# Patient Record
Sex: Female | Born: 1950 | Race: Black or African American | Hispanic: No | Smoking: Current every day smoker
Health system: Southern US, Community
[De-identification: ages and names within clinical notes are randomized; demographics above are authoritative.]

## PROBLEM LIST (undated history)

## (undated) DIAGNOSIS — E079 Disorder of thyroid, unspecified: Secondary | ICD-10-CM

## (undated) HISTORY — DX: Disorder of thyroid, unspecified: E07.9

---

## 2000-01-30 ENCOUNTER — Other Ambulatory Visit: Admission: RE | Admit: 2000-01-30 | Discharge: 2000-01-30 | Payer: Self-pay | Admitting: Obstetrics and Gynecology

## 2001-03-04 ENCOUNTER — Other Ambulatory Visit: Admission: RE | Admit: 2001-03-04 | Discharge: 2001-03-04 | Payer: Self-pay | Admitting: Obstetrics and Gynecology

## 2016-04-18 ENCOUNTER — Encounter: Payer: Self-pay | Admitting: Internal Medicine

## 2016-04-18 ENCOUNTER — Ambulatory Visit (INDEPENDENT_AMBULATORY_CARE_PROVIDER_SITE_OTHER): Payer: Medicare Other | Admitting: Internal Medicine

## 2016-04-18 VITALS — BP 146/76 | HR 60 | Ht 60.0 in | Wt 132.0 lb

## 2016-04-18 DIAGNOSIS — R7303 Prediabetes: Secondary | ICD-10-CM

## 2016-04-18 DIAGNOSIS — E890 Postprocedural hypothyroidism: Secondary | ICD-10-CM | POA: Diagnosis not present

## 2016-04-18 DIAGNOSIS — Z23 Encounter for immunization: Secondary | ICD-10-CM

## 2016-04-18 LAB — TSH: TSH: 1.37 u[IU]/mL (ref 0.35–4.50)

## 2016-04-18 LAB — T4, FREE: FREE T4: 1 ng/dL (ref 0.60–1.60)

## 2016-04-18 NOTE — Patient Instructions (Addendum)
Please continue Levothyroxine 75 mg 6/7 days.  Take the thyroid hormone every day, with water, at least 30 minutes before breakfast, separated by at least 4 hours from: - acid reflux medications - calcium - iron - multivitamins  Please call and schedule an eye appt with Dr. Randon Goldsmith: [facility]:  Dr. Jeni Salles MD ?  Address: [address] [place], [place], [place] [address]  [phone]  Please come back for a follow-up appointment in 6 months.  PATIENT INSTRUCTIONS FOR TYPE 2 DIABETES:  **Please join MyChart!** - see attached instructions about how to join if you have not done so already.  DIET AND EXERCISE Diet and exercise is an important part of diabetic treatment.  We recommended aerobic exercise in the form of brisk walking (working between 40-60% of maximal aerobic capacity, similar to brisk walking) for 150 minutes per week (such as 30 minutes five days per week) along with 3 times per week performing 'resistance' training (using various gauge rubber tubes with handles) 5-10 exercises involving the major muscle groups (upper body, lower body and core) performing 10-15 repetitions (or near fatigue) each exercise. Start at half the above goal but build slowly to reach the above goals. If limited by weight, joint pain, or disability, we recommend daily walking in a swimming pool with water up to waist to reduce pressure from joints while allow for adequate exercise.    BLOOD GLUCOSES Monitoring your blood glucoses is important for continued management of your diabetes. Please check your blood glucoses 2-4 times a day: fasting, before meals and at bedtime (you can rotate these measurements - e.g. one day check before the 3 meals, the next day check before 2 of the meals and before bedtime, etc.).   HYPOGLYCEMIA (low blood sugar) Hypoglycemia is usually a reaction to not eating, exercising, or taking too much insulin/ other diabetes drugs.  Symptoms include  tremors, sweating, hunger, confusion, headache, etc. Treat IMMEDIATELY with 15 grams of Carbs: . 4 glucose tablets .  cup regular juice/soda . 2 tablespoons raisins . 4 teaspoons sugar . 1 tablespoon honey Recheck blood glucose in 15 mins and repeat above if still symptomatic/blood glucose <100.  RECOMMENDATIONS TO REDUCE YOUR RISK OF DIABETIC COMPLICATIONS: * Take your prescribed MEDICATION(S) * Follow a DIABETIC diet: Complex carbs, fiber rich foods, (monounsaturated and polyunsaturated) fats * AVOID saturated/trans fats, high fat foods, >2,300 mg salt per day. * EXERCISE at least 5 times a week for 30 minutes or preferably daily.  * DO NOT SMOKE OR DRINK more than 1 drink a day. * Check your FEET every day. Do not wear tightfitting shoes. Contact us if you develop an ulcer * See your EYE doctor once a year or more if needed * Get a FLU shot once a year * Get a PNEUMONIA vaccine once before and once after age 31 years  GOALS:  * Your Hemoglobin A1c of <7%  * fasting sugars need to be <130 * after meals sugars need to be <180 (2h after you start eating) * Your Systolic BP should be 140 or lower  * Your Diastolic BP should be 80 or lower  * Your HDL (Good Cholesterol) should be 40 or higher  * Your LDL (Bad Cholesterol) should be 100 or lower. * Your Triglycerides should be 150 or lower  * Your Urine microalbumin (kidney function) should be <30 * Your Body Mass Index should be 25 or lower    Please consider the following ways to  cut down carbs and fat and increase fiber and micronutrients in your diet: - substitute whole grain for white bread or pasta - substitute brown rice for white rice - substitute 90-calorie flat bread pieces for slices of bread when possible - substitute sweet potatoes or yams for white potatoes - substitute humus for margarine - substitute tofu for cheese when possible - substitute almond or rice milk for regular milk (would not drink soy milk daily  due to concern for soy estrogen influence on breast cancer risk) - substitute dark chocolate for other sweets when possible - substitute water - can add lemon or orange slices for taste - for diet sodas (artificial sweeteners will trick your body that you can eat sweets without getting calories and will lead you to overeating and weight gain in the long run) - do not skip breakfast or other meals (this will slow down the metabolism and will result in more weight gain over time)  - can try smoothies made from fruit and almond/rice milk in am instead of regular breakfast - can also try old-fashioned (not instant) oatmeal made with almond/rice milk in am - order the dressing on the side when eating salad at a restaurant (pour less than half of the dressing on the salad) - eat as little meat as possible - can try juicing, but should not forget that juicing will get rid of the fiber, so would alternate with eating raw veg./fruits or drinking smoothies - use as little oil as possible, even when using olive oil - can dress a salad with a mix of balsamic vinegar and lemon juice, for e.g. - use agave nectar, stevia sugar, or regular sugar rather than artificial sweateners - steam or broil/roast veggies  - snack on veggies/fruit/nuts (unsalted, preferably) when possible, rather than processed foods - reduce or eliminate aspartame in diet (it is in diet sodas, chewing gum, etc) Read the labels!  Try to read Dr. Katherina RightNeal Barnard's book: "Program for Reversing Diabetes" for other ideas for healthy eating.

## 2016-04-18 NOTE — Progress Notes (Signed)
Patient ID: Vanessa Steele, female   DOB: Jul 15, 1951, 65 y.o.   MRN: [id number]    HPI  Vanessa Steele is a 65 y.o.-year-old female, self-referred, for management of postablative hypothyroidism and prediabetes. She recently moved to [place] from [place]. She is originally from here.  Pt. has been dx with hypothyroidism in 06/2007 after radioactive iodine ablation for Graves' disease >> on Synthroid 75 mcg 6 out of 7 days.  She takes the thyroid hormone: - every day - fasting - with water - separated by 1h min from b'fast  - + coffee + cream - no calcium, iron, PPIs - + multivitamins at lunchtime  I reviewed pt's thyroid tests: 11/05/2015: TSH 2.010, free T4 1.27 07/10/2015: TSH 2.59 03/10/2015: TSH 0.109, free T4 1.78 09/30/2014: TSH 0.365, free T4 1.79  Pt describes: - weight gain - fatigue - cold intolerance - depression - + constipation - dry skin - hair loss  Pt denies feeling nodules in neck, hoarseness, dysphagia/odynophagia, SOB with lying down.  She has + FH of thyroid disorders in: 2 sisters: hyper and hypothyroidism. No FH of thyroid cancer.  No h/o radiation tx to head or neck, RAI tx in 2008. No recent use of iodine supplements.  She is  also has prediabetes:   She was on metformin 1000 mg 2x a day now stopped. >>   Last hemoglobin A1c: 6.1% (11/12/2015).  Last BUN/Cr: 11/06/15: 13/0.82, eGFR 87  Last Lipid panel: 07/10/2015: 221/87/65/139. Not on statins.   Last eye exam - undilated - 2016.   No FH of DM2. Mother and father with HTN.  ROS: Constitutional: no weight gain/loss, no fatigue, no subjective hyperthermia/hypothermia Eyes: + blurry vision, no xerophthalmia ENT: no sore throat, no nodules palpated in throat, no dysphagia/odynophagia, no hoarseness Cardiovascular: no CP/SOB/palpitations/+ slight leg swelling Respiratory: no cough/SOB Gastrointestinal: no N/V/D/+ C Musculoskeletal: no muscle/+ joint aches Skin: no  rashes Neurological: no tremors/numbness/tingling/dizziness Psychiatric: no depression/anxiety  PMH: - Hypothyroidism, postablative - Prediabetes  No past surgical history.  Social History   Social History  . Marital status: Legally Separated    Spouse name: N/A  . Number of children: 0   Occupational History  . retired   Social History Main Topics  . Smoking status: Current Every Day Smoker: 1/2-1 PPD  . Smokeless tobacco: No  . Alcohol use No  . Drug use: No   Meds: - Levothyroxine 75 mcg 6/7 days - vitamin D 1000 IU daily - K 550 mg daily - Mg 400 mg daily - Centrum MVI 1 a day  NKDA   FH: - see HPI+ HTN in M and F Heart ds in M Cancer in F  PE: BP (!) 146/76   Pulse 60   Ht 5' (1.524 m)   Wt 132 lb (59.9 kg)   BMI 25.78 kg/m  Wt Readings from Last 3 Encounters:  04/18/16 132 lb (59.9 kg)   Constitutional: normal weight, in NAD Eyes: PERRLA, EOMI, no exophthalmos ENT: moist mucous membranes, no thyromegaly, no cervical lymphadenopathy Cardiovascular: RRR, No MRG Respiratory: CTA B Gastrointestinal: abdomen soft, NT, ND, BS+ Musculoskeletal: no deformities, strength intact in all 4 Skin: moist, warm, no rashes Neurological: no tremor with outstretched hands, DTR normal in all 4  ASSESSMENT: 1. Hypothyroidism - postablative  2. Prediabetes  PLAN:  1. Patient with long-standing hypothyroidism after RAI tx for Graves ds., on levothyroxine therapy. She appears euthyroid and latest TFTs were normal on her current LT4 dose, of 75 mcg  6/7 days - she does not appear to have a goiter, thyroid nodules, or neck compression symptoms - We discussed about correct intake of levothyroxine, fasting, with water, separated by at least 30 minutes from breakfast, and separated by more than 4 hours from calcium, iron, multivitamins, acid reflux medications (PPIs). She is taking it correctly. - will check thyroid tests today: TSH, free T4 - If labs today are  abnormal, she will need to return in 6 weeks for repeat labs - Otherwise, I will see her back in 6 months  2. Prediabetes - was on metformin but stopped 2/2 negative publicity >> discussed that it is probably the best DM drug we have. However, I am not sure if she needs to restart it: will check a HbA1c today. - she is not checking sugars at home >> meter lost during moving from [place]. She will look for it >> advised to check at least 1x every 2nd day - discussed about improving diet - will recheck Lipids at next visit >> will liekly need a statin - advised to get a new eye exam >> dilated >> recommended Dr Prudencio Burly  Component     Latest Ref Rng & Units 04/18/2016  T4,Free(Direct)     0.60 - 1.60 ng/dL 1.00  TSH     0.35 - 4.50 uIU/mL 1.37  Thyroid tests are great. We'll continue the current dose of levothyroxine.  Philemon Kingdom, MD PhD [facility]

## 2016-04-21 ENCOUNTER — Encounter: Payer: Self-pay | Admitting: Internal Medicine

## 2016-04-21 ENCOUNTER — Telehealth: Payer: Self-pay

## 2016-04-21 NOTE — Telephone Encounter (Signed)
Called and left voicemail for patient regarding lab results, and no changes needing to be made to medications.

## 2016-04-22 ENCOUNTER — Telehealth: Payer: Self-pay

## 2016-04-22 ENCOUNTER — Other Ambulatory Visit: Payer: Self-pay

## 2016-04-22 MED ORDER — LEVOTHYROXINE SODIUM 75 MCG PO TABS: 75.0000 ug | ORAL_TABLET | Freq: Every day | ORAL | 1 refills | Status: DC

## 2016-04-22 NOTE — Telephone Encounter (Signed)
Called and left message for patient to call back to make nurse visit for a1c. Gave call back number.

## 2016-04-25 ENCOUNTER — Other Ambulatory Visit (INDEPENDENT_AMBULATORY_CARE_PROVIDER_SITE_OTHER): Payer: Medicare Other

## 2016-04-25 ENCOUNTER — Other Ambulatory Visit: Payer: Medicare Other

## 2016-04-25 ENCOUNTER — Other Ambulatory Visit: Payer: Self-pay

## 2016-04-25 DIAGNOSIS — R7303 Prediabetes: Secondary | ICD-10-CM | POA: Diagnosis not present

## 2016-04-25 LAB — POCT GLYCOSYLATED HEMOGLOBIN (HGB A1C): Hemoglobin A1C: 5.8

## 2016-10-17 ENCOUNTER — Ambulatory Visit: Payer: Medicare Other | Admitting: Internal Medicine

## 2016-10-20 ENCOUNTER — Encounter: Payer: Self-pay | Admitting: Internal Medicine

## 2016-10-21 ENCOUNTER — Other Ambulatory Visit: Payer: Self-pay

## 2016-10-21 MED ORDER — LEVOTHYROXINE SODIUM 75 MCG PO TABS: 75.0000 ug | ORAL_TABLET | Freq: Every day | ORAL | 1 refills | Status: DC

## 2016-11-24 ENCOUNTER — Ambulatory Visit (INDEPENDENT_AMBULATORY_CARE_PROVIDER_SITE_OTHER): Payer: Medicare Other | Admitting: Internal Medicine

## 2016-11-24 ENCOUNTER — Encounter: Payer: Self-pay | Admitting: Internal Medicine

## 2016-11-24 VITALS — BP 130/82 | HR 78 | Wt 141.0 lb

## 2016-11-24 DIAGNOSIS — R7303 Prediabetes: Secondary | ICD-10-CM

## 2016-11-24 DIAGNOSIS — E890 Postprocedural hypothyroidism: Secondary | ICD-10-CM

## 2016-11-24 LAB — MICROALBUMIN / CREATININE URINE RATIO
Creatinine,U: 215.2 mg/dL
Microalb Creat Ratio: 0.6 mg/g (ref 0.0–30.0)
Microalb, Ur: 1.3 mg/dL (ref 0.0–1.9)

## 2016-11-24 LAB — LIPID PANEL
CHOLESTEROL: 208 mg/dL — AB (ref 0–200)
HDL: 58.4 mg/dL (ref >39.00)
LDL CALC: 119 mg/dL — AB (ref 0–99)
NonHDL: 149.73
TRIGLYCERIDES: 153 mg/dL — AB (ref 0.0–149.0)
Total CHOL/HDL Ratio: 4
VLDL: 30.6 mg/dL (ref 0.0–40.0)

## 2016-11-24 LAB — TSH: TSH: 1.26 u[IU]/mL (ref 0.35–4.50)

## 2016-11-24 LAB — T4, FREE: FREE T4: 0.82 ng/dL (ref 0.60–1.60)

## 2016-11-24 LAB — HEMOGLOBIN A1C: Hgb A1c MFr Bld: 6.1 % (ref 4.6–6.5)

## 2016-11-24 NOTE — Progress Notes (Signed)
Patient ID: Vanessa Steele, female   DOB: 1951/02/09, 66 y.o.   MRN: [id number]    HPI  Vanessa Steele is a 66 y.o.-year-old female, returning for follow-up for postablative hypothyroidism and prediabetes.  Last visit 8 months ago. New PCP: Dr. Hilma Favors  Pt. has been dx with hypothyroidism in 06/2007 after radioactive iodine ablation for Graves' disease >> on Synthroid 75 mcg 6 out of 7 days.  She takes this: - in am - fasting - + coffee + creamer + Splenda after she walks the dog (1.5 hrs)  - at least 30 min from b'fast - no Ca, Fe, MVI, PPIs - not on Biotin  I reviewed pt's thyroid tests: Lab Results  Component Value Date   TSH 1.37 04/18/2016   FREET4 1.00 04/18/2016  11/05/2015: TSH 2.010, free T4 1.27 07/10/2015: TSH 2.59 03/10/2015: TSH 0.109, free T4 1.78 09/30/2014: TSH 0.365, free T4 1.79  Pt describes: - + weight gain  No: - fatigue - cold intolerance - depression - constipation - dry skin - hair loss  Pt denies feeling nodules in neck, hoarseness, dysphagia/odynophagia, SOB with lying down.  She has + FH of thyroid disorders in: 2 sisters: hyper and hypothyroidism. No FH of thyroid cancer.  No h/o radiation tx to head or neck, RAI tx in 2008. No recent use of iodine supplements.  She is  also has prediabetes:   She gained weight since last visit >> eating more frequently, grazing, high calorie foods.  She was on metformin 1000 mg twice a day, but stopped. I kept her off at last visit.  Last hemoglobin A1c:  Lab Results  Component Value Date   HGBA1C 5.8 04/25/2016  11/12/2015: HbA1c: 6.1%  Last BUN/Cr: 11/06/15: 13/0.82, eGFR 87  Last Lipid panel: 07/10/2015: 221/87/65/139. Not on statins.   Last eye exam - undilated in 2016  No FH of DM2. Mother and father with HTN.  ROS: Constitutional: + weight gain, no fatigue, no subjective hyperthermia, no subjective hypothermia Eyes: no blurry vision, no xerophthalmia ENT: no sore throat, no  nodules palpated in throat, no dysphagia, no odynophagia, no hoarseness Cardiovascular: no CP/no SOB/no palpitations/no leg swelling Respiratory: no cough/no SOB/no wheezing Gastrointestinal: no N/no V/no D/no C/no acid reflux Musculoskeletal: no muscle aches/no joint aches Skin: no rashes, no hair loss Neurological: no tremors/no numbness/no tingling/no dizziness  I reviewed pt's medications, allergies, PMH, social hx, family hx, and changes were documented in the history of present illness. Otherwise, unchanged from my initial visit note.  PMH: - Hypothyroidism, postablative - Prediabetes  No past surgical history.  Social History   Social History  . Marital status: Legally Separated    Spouse name: N/A  . Number of children: 0   Occupational History  . retired   Social History Main Topics  . Smoking status: Current Every Day Smoker: 1/2-1 PPD  . Smokeless tobacco: No  . Alcohol use No  . Drug use: No   Meds: - Levothyroxine 75 mcg 6/7 days - vitamin D 1000 IU daily - K 550 mg daily - Mg 400 mg daily - Centrum MVI 1 a day  NKDA   FH: - see HPI+ HTN in M and F Heart ds in M Cancer in F  PE: BP 130/82 (BP Location: Left Arm, Patient Position: Sitting)   Pulse 78   Wt 141 lb (64 kg)   SpO2 98%   BMI 27.54 kg/m  Wt Readings from Last 3 Encounters:  11/24/16 141 lb (  64 kg)  04/18/16 132 lb (59.9 kg)   Constitutional: Slightly overweight, in NAD Eyes: PERRLA, EOMI, no exophthalmos ENT: moist mucous membranes, no thyromegaly, no cervical lymphadenopathy Cardiovascular: RRR, No MRG Respiratory: CTA B Gastrointestinal: abdomen soft, NT, ND, BS+ Musculoskeletal: no deformities, strength intact in all 4 Skin: moist, warm, no rashes Neurological: no tremor with outstretched hands, DTR normal in all 4  ASSESSMENT: 1. Hypothyroidism - postablative  2. Prediabetes  PLAN:  1. Patient with long-standing hypothyroidism after RAI tx for Graves ds., on  levothyroxine therapy: 75 mcg 6/7 days - latest thyroid labs reviewed with pt >> normal  - pt feels good on this dose. - we discussed about taking the thyroid hormone every day, with water, >30 minutes before breakfast, separated by >4 hours from acid reflux medications, calcium, iron, multivitamins. Pt. is taking it correctly - will check thyroid tests today: TSH and fT4 - If labs are abnormal, she will need to return for repeat TFTs in 1.5 months - OTW, Return in about 6 months (around 05/27/2017).  2. Prediabetes - was on metformin but stopped 2/2 negative publicity >> at last visit, we did not restart, as her HbA1c was very good, at 5.8%. However, since then, she gained a significant amount of weight (9 pounds) so it is possible that her HbA1c increase. We did discuss at this visit whether to restart metformin or to go back on her diet if the HbA1c is higher. Very much prefer to go back on her diet rather than starting the medication. - At this visit, we'll check the following labs: Orders Placed This Encounter  Procedures  . T4, free  . TSH  . Hemoglobin A1c  . Microalbumin / creatinine urine ratio  . COMPLETE METABOLIC PANEL WITH GFR  . Lipid panel  - She will likely need a staffing when the labs are back - advised to get a new eye exam >> dilated >> recommended Dr Prudencio Burly - did not have this yet - She will have an appointment with Dr. Hilma Favors (new PCP) next week  Component     Latest Ref Rng & Units 11/24/2016  Sodium     135 - 146 mmol/L 142  Potassium     3.5 - 5.3 mmol/L 4.1  Chloride     98 - 110 mmol/L 105  CO2     20 - 31 mmol/L 30  Glucose     65 - 99 mg/dL 73  BUN     7 - 25 mg/dL 11  Creatinine     0.50 - 0.99 mg/dL 0.70  Total Bilirubin     0.2 - 1.2 mg/dL 0.3  Alkaline Phosphatase     33 - 130 U/L 71  AST     10 - 35 U/L 13  ALT     6 - 29 U/L 8  Total Protein     6.1 - 8.1 g/dL 7.3  Albumin     3.6 - 5.1 g/dL 4.2  Calcium     8.6 - 10.4 mg/dL 9.4   GFR, Est African American     >=60 mL/min >89  GFR, Est Non African American     >=60 mL/min >89  Cholesterol     0 - 200 mg/dL 208 (H)  Triglycerides     0.0 - 149.0 mg/dL 153.0 (H)  HDL Cholesterol     >39.00 mg/dL 58.40  VLDL     0.0 - 40.0 mg/dL 30.6  LDL (calc)  0 - 99 mg/dL 119 (H)  Total CHOL/HDL Ratio      4  NonHDL      149.73  Microalb, Ur     0.0 - 1.9 mg/dL 1.3  Creatinine,U     mg/dL 215.2  MICROALB/CREAT RATIO     0.0 - 30.0 mg/g 0.6  T4,Free(Direct)     0.60 - 1.60 ng/dL 0.82  TSH     0.35 - 4.50 uIU/mL 1.26  Hemoglobin A1C     4.6 - 6.5 % 6.1   TFTs normal. ACR normal. LDL better!  Philemon Kingdom, MD PhD [facility]

## 2016-11-24 NOTE — Patient Instructions (Signed)
Please stop at the lab.  Please continue Levothyroxine 75 mg 6/7 days.  Take the thyroid hormone every day, with water, at least 30 minutes before breakfast, separated by at least 4 hours from: - acid reflux medications - calcium - iron - multivitamins  Please call and schedule an eye appt with Dr. Randon GoldsmithLyles: [facility]:  Dr. Jeni SallesGraham W. Lyles MD ?  Address: [address], [place], [place] [address]  [phone]  Please come back for a follow-up appointment in 6 months.

## 2016-11-25 LAB — COMPLETE METABOLIC PANEL WITH GFR
ALT: 8 U/L (ref 6–29)
AST: 13 U/L (ref 10–35)
Albumin: 4.2 g/dL (ref 3.6–5.1)
Alkaline Phosphatase: 71 U/L (ref 33–130)
BUN: 11 mg/dL (ref 7–25)
CHLORIDE: 105 mmol/L (ref 98–110)
CO2: 30 mmol/L (ref 20–31)
Calcium: 9.4 mg/dL (ref 8.6–10.4)
Creat: 0.7 mg/dL (ref 0.50–0.99)
Glucose, Bld: 73 mg/dL (ref 65–99)
Potassium: 4.1 mmol/L (ref 3.5–5.3)
Sodium: 142 mmol/L (ref 135–146)
Total Bilirubin: 0.3 mg/dL (ref 0.2–1.2)
Total Protein: 7.3 g/dL (ref 6.1–8.1)

## 2016-12-03 ENCOUNTER — Encounter: Payer: Self-pay | Admitting: *Deleted

## 2016-12-10 ENCOUNTER — Encounter: Payer: Self-pay | Admitting: *Deleted

## 2016-12-18 ENCOUNTER — Ambulatory Visit (INDEPENDENT_AMBULATORY_CARE_PROVIDER_SITE_OTHER): Payer: Medicare Other | Admitting: Women's Health

## 2016-12-18 ENCOUNTER — Encounter: Payer: Self-pay | Admitting: Women's Health

## 2016-12-18 ENCOUNTER — Other Ambulatory Visit (HOSPITAL_COMMUNITY)
Admission: RE | Admit: 2016-12-18 | Discharge: 2016-12-18 | Disposition: A | Discharge status: Home / Self Care | Payer: Medicare Other | Source: Ambulatory Visit | Attending: Obstetrics & Gynecology | Admitting: Obstetrics & Gynecology

## 2016-12-18 VITALS — BP 140/80 | HR 92 | Ht 60.0 in | Wt 140.3 lb

## 2016-12-18 DIAGNOSIS — Z01419 Encounter for gynecological examination (general) (routine) without abnormal findings: Secondary | ICD-10-CM | POA: Diagnosis not present

## 2016-12-18 DIAGNOSIS — F17200 Nicotine dependence, unspecified, uncomplicated: Secondary | ICD-10-CM | POA: Insufficient documentation

## 2016-12-18 NOTE — Addendum Note (Signed)
Addended by: Gaylyn RongEVANS, Gwyneth Fernandez A on: 12/18/2016 10:46 AM   Modules accepted: Orders

## 2016-12-18 NOTE — Patient Instructions (Signed)
1-800-quit-now   Steps to Quit Smoking Smoking tobacco can be harmful to your health and can affect almost every organ in your body. Smoking puts you, and those around you, at risk for developing many serious chronic diseases. Quitting smoking is difficult, but it is one of the best things that you can do for your health. It is never too late to quit. What are the benefits of quitting smoking? When you quit smoking, you lower your risk of developing serious diseases and conditions, such as:  Lung cancer or lung disease, such as COPD.  Heart disease.  Stroke.  Heart attack.  Infertility.  Osteoporosis and bone fractures.  Additionally, symptoms such as coughing, wheezing, and shortness of breath may get better when you quit. You may also find that you get sick less often because your body is stronger at fighting off colds and infections. If you are pregnant, quitting smoking can help to reduce your chances of having a baby of low birth weight. How do I get ready to quit? When you decide to quit smoking, create a plan to make sure that you are successful. Before you quit:  Pick a date to quit. Set a date within the next two weeks to give you time to prepare.  Write down the reasons why you are quitting. Keep this list in places where you will see it often, such as on your bathroom mirror or in your car or wallet.  Identify the people, places, things, and activities that make you want to smoke (triggers) and avoid them. Make sure to take these actions: ? Throw away all cigarettes at home, at work, and in your car. ? Throw away smoking accessories, such as ashtrays and lighters. ? Clean your car and make sure to empty the ashtray. ? Clean your home, including curtains and carpets.  Tell your family, friends, and coworkers that you are quitting. Support from your loved ones can make quitting easier.  Talk with your health care provider about your options for quitting smoking.  Find  out what treatment options are covered by your health insurance.  What strategies can I use to quit smoking? Talk with your healthcare provider about different strategies to quit smoking. Some strategies include:  Quitting smoking altogether instead of gradually lessening how much you smoke over a period of time. Research shows that quitting "cold turkey" is more successful than gradually quitting.  Attending in-person counseling to help you build problem-solving skills. You are more likely to have success in quitting if you attend several counseling sessions. Even short sessions of 10 minutes can be effective.  Finding resources and support systems that can help you to quit smoking and remain smoke-free after you quit. These resources are most helpful when you use them often. They can include: ? Online chats with a counselor. ? Telephone quitlines. ? Printed self-help materials. ? Support groups or group counseling. ? Text messaging programs. ? Mobile phone applications.  Taking medicines to help you quit smoking. (If you are pregnant or breastfeeding, talk with your health care provider first.) Some medicines contain nicotine and some do not. Both types of medicines help with cravings, but the medicines that include nicotine help to relieve withdrawal symptoms. Your health care provider may recommend: ? Nicotine patches, gum, or lozenges. ? Nicotine inhalers or sprays. ? Non-nicotine medicine that is taken by mouth.  Talk with your health care provider about combining strategies, such as taking medicines while you are also receiving in-person counseling. Using these   two strategies together makes you more likely to succeed in quitting than if you used either strategy on its own. If you are pregnant or breastfeeding, talk with your health care provider about finding counseling or other support strategies to quit smoking. Do not take medicine to help you quit smoking unless told to do so by  your health care provider. What things can I do to make it easier to quit? Quitting smoking might feel overwhelming at first, but there is a lot that you can do to make it easier. Take these important actions:  Reach out to your family and friends and ask that they support and encourage you during this time. Call telephone quitlines, reach out to support groups, or work with a counselor for support.  Ask people who smoke to avoid smoking around you.  Avoid places that trigger you to smoke, such as bars, parties, or smoke-break areas at work.  Spend time around people who do not smoke.  Lessen stress in your life, because stress can be a smoking trigger for some people. To lessen stress, try: ? Exercising regularly. ? Deep-breathing exercises. ? Yoga. ? Meditating. ? Performing a body scan. This involves closing your eyes, scanning your body from head to toe, and noticing which parts of your body are particularly tense. Purposefully relax the muscles in those areas.  Download or purchase mobile phone or tablet apps (applications) that can help you stick to your quit plan by providing reminders, tips, and encouragement. There are many free apps, such as QuitGuide from the CDC (Centers for Disease Control and Prevention). You can find other support for quitting smoking (smoking cessation) through smokefree.gov and other websites.  How will I feel when I quit smoking? Within the first 24 hours of quitting smoking, you may start to feel some withdrawal symptoms. These symptoms are usually most noticeable 2-3 days after quitting, but they usually do not last beyond 2-3 weeks. Changes or symptoms that you might experience include:  Mood swings.  Restlessness, anxiety, or irritation.  Difficulty concentrating.  Dizziness.  Strong cravings for sugary foods in addition to nicotine.  Mild weight gain.  Constipation.  Nausea.  Coughing or a sore throat.  Changes in how your medicines  work in your body.  A depressed mood.  Difficulty sleeping (insomnia).  After the first 2-3 weeks of quitting, you may start to notice more positive results, such as:  Improved sense of smell and taste.  Decreased coughing and sore throat.  Slower heart rate.  Lower blood pressure.  Clearer skin.  The ability to breathe more easily.  Fewer sick days.  Quitting smoking is very challenging for most people. Do not get discouraged if you are not successful the first time. Some people need to make many attempts to quit before they achieve long-term success. Do your best to stick to your quit plan, and talk with your health care provider if you have any questions or concerns. This information is not intended to replace advice given to you by your health care provider. Make sure you discuss any questions you have with your health care provider. Document Released: 07/01/2001 Document Revised: 03/04/2016 Document Reviewed: 11/21/2014 Elsevier Interactive Patient Education  2017 Elsevier Inc.  

## 2016-12-18 NOTE — Progress Notes (Signed)
Subjective:   Vanessa EnsignRhonda E Keeven is a 66 y.o. [id number] African American female here for a gynecological exam only, full physical done by PCP at [facility] already.  No LMP recorded.   Menses stopped @ 66yo, no bleeding since. Still occasionally sexually active- no problems. Just moved back here from [place] about 8-269mths ago. Had screening mamm[place] last week in [place], hasn't received results yet. Had TCS earlier in year, normal.  Current complaints: none PCP: [facility]       Does not desire labs, done w/ PCP  Social History: Sexual: heterosexual Marital Status: divorced Living situation: alone Occupation: retired Tobacco/alcohol: 5-6 cigar Illicit drugs: no history of illicit drug use  The following portions of the patient's history were reviewed and updated as appropriate: allergies, current medications, past family history, past medical history, past social history, past surgical history and problem list.  Past Medical History Past Medical History:  Diagnosis Date  . Thyroid disease     Past Surgical History History reviewed. No pertinent surgical history.  Gynecologic History G2P0  No LMP recorded. Contraception: post menopausal status Last Pap: 2016 in [place]. Results were: normal Last mammogram: last week. Results were: hasn't gotten results yet Last TCS: earlier this year, normal  Obstetric History OB History  Gravida Para Term Preterm AB Living  1 0     1    SAB TAB Ectopic Multiple Live Births  1            # Outcome Date GA Lbr Len/2nd Weight Sex Delivery Anes PTL Lv  1 SAB               Current Medications Current Outpatient Prescriptions on File Prior to Visit  Medication Sig Dispense Refill  . levothyroxine (SYNTHROID, LEVOTHROID) 75 MCG tablet Take 1 tablet (75 mcg total) by mouth daily before breakfast. 6 out of 7 days 90 tablet 1   No current facility-administered medications on file prior to visit.     Review of Systems Patient denies any headaches,  blurred vision, shortness of breath, chest pain, abdominal pain, problems with bowel movements, urination, or intercourse.  Objective:  BP 140/80   Pulse 92   Ht 5' (1.524 m)   Wt 140 lb 4.8 oz (63.6 kg)   BMI 27.40 kg/m  Physical Exam  General:  Well developed, well nourished, no acute distress. She is alert and oriented x3. Skin:  Warm and dry, many nevi- has never seen dermatologist Breasts:  No dominant palpable mass, retraction, or nipple discharge Abdomen:  Soft, non tender, no hepatosplenomegaly or masses Pelvic:  External genitalia is normal in appearance.  The vagina is normal in appearance, loss of color/rugae c/w postmenopausal status. The cervix is bulbous, no CMT.  Thin prep pap is done w/ HR HPV cotesting. Uterus is felt to be normal size, shape, and contour.  No adnexal masses or tenderness noted. Psych:  She has a normal mood and affect  Assessment:   Gynecological exam/pap smear only Postmenopausal Smoker  Plan:  Advised smoking cessation, gave printed tips F/U 5022yr for gyn physical, or sooner if needed Discussed can stop pap smears at this age, or continue if desires- she desires to continue- pap in 4743yrs if this pap normal Mammogram 4922yr if this one was normal, or sooner if problems Colonoscopy per GI or sooner if problems  Marge DuncansBooker, Yariel Ferraris Randall CNM, [facility] 12/18/2016 10:34 AM

## 2016-12-19 LAB — CYTOLOGY - PAP
Adequacy: ABSENT
DIAGNOSIS: NEGATIVE
HPV (WINDOPATH): NOT DETECTED

## 2017-05-27 ENCOUNTER — Ambulatory Visit: Payer: Medicare Other | Admitting: Internal Medicine

## 2017-05-29 ENCOUNTER — Other Ambulatory Visit: Payer: Self-pay

## 2017-05-29 MED ORDER — LEVOTHYROXINE SODIUM 75 MCG PO TABS: 75.0000 ug | ORAL_TABLET | Freq: Every day | ORAL | 1 refills | Status: DC

## 2017-07-30 ENCOUNTER — Ambulatory Visit: Payer: Medicare Other | Admitting: Internal Medicine

## 2017-08-28 ENCOUNTER — Ambulatory Visit (INDEPENDENT_AMBULATORY_CARE_PROVIDER_SITE_OTHER): Payer: Medicare Other | Admitting: Internal Medicine

## 2017-08-28 ENCOUNTER — Encounter: Payer: Self-pay | Admitting: Internal Medicine

## 2017-08-28 VITALS — BP 122/83 | HR 89 | Ht 60.0 in | Wt 143.0 lb

## 2017-08-28 DIAGNOSIS — R7303 Prediabetes: Secondary | ICD-10-CM

## 2017-08-28 DIAGNOSIS — E890 Postprocedural hypothyroidism: Secondary | ICD-10-CM | POA: Diagnosis not present

## 2017-08-28 LAB — POCT GLYCOSYLATED HEMOGLOBIN (HGB A1C): HEMOGLOBIN A1C: 6

## 2017-08-28 LAB — T4, FREE: FREE T4: 0.99 ng/dL (ref 0.60–1.60)

## 2017-08-28 LAB — TSH: TSH: 1.55 u[IU]/mL (ref 0.35–4.50)

## 2017-08-28 NOTE — Patient Instructions (Addendum)
Please stop at the lab.  Please continue Levothyroxine 75 mg 6/7 days.  Take the thyroid hormone every day, with water, at least 30 minutes before breakfast, separated by at least 4 hours from: - acid reflux medications - calcium - iron - multivitamins  Please come back for a follow-up appointment in 1 year.

## 2017-08-28 NOTE — Progress Notes (Signed)
Patient ID: MADYSON LUKACH, female   DOB: Sep 16, 1950, 67 y.o.   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  Last visit 9 months ago.  Hypothyroidism  - Developed in 06/2017 after RAI ablation for Graves' disease in 06/2007 after radioactive iodine ablation for Graves' disease.  Pt is on levothyroxine 75 mcg 6/7 days: taken: - in am - fasting - + coffee + creamer + Splenda after she walks the dog (1.5 hrs)  - at least 30 min from b'fast - no Ca, Fe, MVI, PPIs - not on Biotin  Reviewed patient's TFTs: Lab Results  Component Value Date   TSH 1.26 11/24/2016   TSH 1.37 04/18/2016   FREET4 0.82 11/24/2016   FREET4 1.00 04/18/2016  11/05/2015: TSH 2.010, free T4 1.27 07/10/2015: TSH 2.59 03/10/2015: TSH 0.109, free T4 1.78 09/30/2014: TSH 0.365, free T4 1.79  Pt denies: - feeling nodules in neck - hoarseness - dysphagia - choking - SOB with lying down  She has + FH of thyroid disorders in: 2 sisters: hyper and hypothyroidism. No FH of thyroid cancer. No h/o radiation tx to head or neck;   Except RAI treatment in 2008.  No seaweed or kelp. No recent contrast studies. No herbal supplements. No Biotin use. No recent steroids use.   Prediabetes:  She was on metformin 1000 mg twice a day, but stopped due to negative publicity.  We did not restart this at last visit.  Last hemoglobin A1c was higher due to eating more frequently, grazing, and eating high calorie foods: Lab Results  Component Value Date   HGBA1C 6.1 11/24/2016   HGBA1C 5.8 04/25/2016  11/12/2015: HbA1c: 6.1%  Last BUN/Cr: Lab Results  Component Value Date   BUN 11 11/24/2016   CREATININE 0.70 11/24/2016  11/06/15: 13/0.82, eGFR 87  + HL; last Lipid panel: Lab Results  Component Value Date   CHOL 208 (H) 11/24/2016   HDL 58.40 11/24/2016   LDLCALC 119 (H) 11/24/2016   TRIG 153.0 (H) 11/24/2016   CHOLHDL 4 11/24/2016  07/10/2015: 221/87/65/139. Not on a statin.  Last eye exam -nondilated, in 2016.  No family history of type 2 diabetes. Mother and father with HTN.  ROS: Constitutional: no weight gain/no weight loss, no fatigue, no subjective hyperthermia, no subjective hypothermia Eyes: no blurry vision, no xerophthalmia ENT: no sore throat,+ see HPI Cardiovascular: no CP/no SOB/no palpitations/no leg swelling Respiratory: no cough/no SOB/no wheezing Gastrointestinal: no N/no V/no D/no C/no acid reflux Musculoskeletal: no muscle aches/no joint aches Skin: no rashes, no hair loss Neurological: no tremors/no numbness/no tingling/no dizziness  I reviewed pt's medications, allergies, PMH, social hx, family hx, and changes were documented in the history of present illness. Otherwise, unchanged from my initial visit note.  PMH: - Hypothyroidism, postablative - Prediabetes  No past surgical history.  Social History   Social History  . Marital status: Legally Separated    Spouse name: N/A  . Number of children: 0   Occupational History  . retired   Social History Main Topics  . Smoking status: Current Every Day Smoker: 1/2-1 PPD  . Smokeless tobacco: No  . Alcohol use No  . Drug use: No   Meds: Current Outpatient Medications  Medication Sig Dispense Refill  . levothyroxine (SYNTHROID, LEVOTHROID) 75 MCG tablet Take 1 tablet (75 mcg total) daily before breakfast by mouth. 6 out of 7 days 78 tablet 1  No current facility-administered medications for this visit.   - vitamin D 1000 IU daily - K 550 mg daily - Mg 400 mg daily - Centrum MVI 1 a day  NKDA   FH: - see HPI+ HTN in M and F Heart ds in M Cancer in F  PE: BP 122/83   Pulse 89   Ht 5' (1.524 m)   Wt 143 lb (64.9 kg)   SpO2 98%   BMI 27.93 kg/m  Wt Readings from Last 3 Encounters:  08/28/17 143 lb (64.9 kg)  12/18/16 140 lb 4.8 oz (63.6 kg)  11/24/16 141 lb (64 kg)   Constitutional:  overweight, in NAD Eyes: PERRLA, EOMI, no exophthalmos ENT: moist mucous membranes, no thyromegaly, no cervical lymphadenopathy Cardiovascular: RRR, No MRG Respiratory: CTA B Gastrointestinal: abdomen soft, NT, ND, BS+ Musculoskeletal: no deformities, strength intact in all 4 Skin: moist, warm, no rashes Neurological: no tremor with outstretched hands, DTR normal in all 4  ASSESSMENT: 1. Hypothyroidism - postablative  2. Prediabetes  PLAN:  1. Patient with hypothyroidism developed after RAI treatment for Graves' disease,  On levothyroxine treatment - latest thyroid labs reviewed with pt >> normal  - she continues on LT4 75 mcg 6/7 days mcg daily - pt feels good on this dose. - we discussed about taking the thyroid hormone every day, with water, >30 minutes before breakfast, separated by >4 hours from acid reflux medications, calcium, iron, multivitamins. Pt. is taking it correctly. - will check thyroid tests today: TSH and fT4 - If labs are abnormal, she will need to return for repeat TFTs in 1.5 months, OTW, RTC in  1 year  2. Prediabetes -Patient previously on metformin, which is stopped due to negative publicity.  At last visit, her HbA1c was higher, at 6.1%, increased from 5.8%, and she also gained weight.  At that time, she preferred to go back to her diet, rather than restarting the metformin. She continues to aim to improve her diet >> discussed about healthy changes. - again advised to get a new eye exam (dilated) - HbA1c at this visit is slightly better, at 6.0%.  Office Visit on 08/28/2017  Component Date Value Ref Range Status  . TSH 08/28/2017 1.55  0.35 - 4.50 uIU/mL Final  . Free T4 08/28/2017 0.99  0.60 - 1.60 ng/dL Final   Comment: Specimens from patients who are undergoing biotin therapy and /or ingesting biotin supplements may contain high levels of biotin.  The higher biotin concentration in these specimens interferes with this Free T4 assay.  Specimens that  contain high levels  of biotin may cause false high results for this Free T4 assay.  Please interpret results in light of the total clinical presentation of the patient.    . Hemoglobin A1C 08/28/2017 6.0   Final   Thyroid tests are normal.  Continue current levothyroxine dose.  Philemon Kingdom, MD PhD [facility]

## 2017-09-24 ENCOUNTER — Telehealth: Payer: Self-pay | Admitting: Internal Medicine

## 2017-09-24 NOTE — Telephone Encounter (Signed)
Need refill Original Order:  levothyroxine (SYNTHROID, LEVOTHROID) 75 MCG tablet [[id number]][[id number]]    Pharmacy:  [id number] - [place], [place] - [id number]

## 2017-09-25 ENCOUNTER — Telehealth: Payer: Self-pay | Admitting: Internal Medicine

## 2017-09-25 MED ORDER — LEVOTHYROXINE SODIUM 75 MCG PO TABS: 75.0000 ug | ORAL_TABLET | Freq: Every day | ORAL | 1 refills | Status: DC

## 2017-09-25 NOTE — Telephone Encounter (Signed)
Called pharmacy. Gave VO ok to change of manufacturer.

## 2017-09-25 NOTE — Telephone Encounter (Signed)
[facility] needs to know if they can change the manufacturer on Levothyroxine. Please call ph# [phone] to advise

## 2017-09-25 NOTE — Telephone Encounter (Signed)
Done. Left vmail for pt.

## 2018-04-29 ENCOUNTER — Telehealth: Payer: Self-pay | Admitting: Internal Medicine

## 2018-04-29 ENCOUNTER — Other Ambulatory Visit: Payer: Self-pay

## 2018-04-29 MED ORDER — LEVOTHYROXINE SODIUM 75 MCG PO TABS: 75.0000 ug | ORAL_TABLET | Freq: Every day | ORAL | 2 refills | Status: DC

## 2018-04-29 NOTE — Telephone Encounter (Signed)
Patient requests RX with refills (since patient only comes in once a year she needs RX for Synthroid to cover that time period) for Synthroid sent to [facility] in [place]. Patient is almost out of Synthroid.

## 2018-04-29 NOTE — Telephone Encounter (Signed)
Patient notified

## 2018-04-29 NOTE — Telephone Encounter (Signed)
RX refill sent 

## 2018-08-24 ENCOUNTER — Ambulatory Visit: Payer: Medicare Other | Admitting: Internal Medicine

## 2018-11-04 ENCOUNTER — Telehealth: Payer: Self-pay | Admitting: Internal Medicine

## 2018-11-04 ENCOUNTER — Other Ambulatory Visit: Payer: Self-pay | Admitting: Internal Medicine

## 2018-11-04 DIAGNOSIS — R7303 Prediabetes: Secondary | ICD-10-CM

## 2018-11-04 DIAGNOSIS — E890 Postprocedural hypothyroidism: Secondary | ICD-10-CM

## 2018-11-04 NOTE — Telephone Encounter (Signed)
Ok,.labs are in 

## 2018-11-04 NOTE — Telephone Encounter (Signed)
Patient called to make appointment for the 1 year follow up.  She is open to a DOXY visit, but wants to know if she can do her labs before hand.  Please advise and I can call her amd make a lab and DOXY visit for her.

## 2018-11-05 NOTE — Telephone Encounter (Signed)
Notified patient of message from Dr. Gherghe, patient expressed understanding and agreement. No further questions.  

## 2019-04-12 ENCOUNTER — Other Ambulatory Visit: Payer: Self-pay

## 2019-04-12 ENCOUNTER — Other Ambulatory Visit (INDEPENDENT_AMBULATORY_CARE_PROVIDER_SITE_OTHER): Payer: Medicare Other

## 2019-04-12 DIAGNOSIS — R7303 Prediabetes: Secondary | ICD-10-CM | POA: Diagnosis not present

## 2019-04-12 DIAGNOSIS — E890 Postprocedural hypothyroidism: Secondary | ICD-10-CM

## 2019-04-12 LAB — TSH: TSH: 3.26 u[IU]/mL (ref 0.35–4.50)

## 2019-04-12 LAB — T4, FREE: Free T4: 1.06 ng/dL (ref 0.60–1.60)

## 2019-04-12 LAB — HEMOGLOBIN A1C: Hgb A1c MFr Bld: 6.1 % (ref 4.6–6.5)

## 2019-04-15 ENCOUNTER — Telehealth: Payer: Self-pay | Admitting: Internal Medicine

## 2019-04-15 MED ORDER — LEVOTHYROXINE SODIUM 75 MCG PO TABS: 75.0000 ug | ORAL_TABLET | Freq: Every day | ORAL | 0 refills | Status: DC

## 2019-04-15 NOTE — Telephone Encounter (Signed)
Patient was due for a follow up in Feb of this year.   Patient is not scheduled until November. We will only give her one refill to last until she is seen.

## 2019-04-15 NOTE — Telephone Encounter (Signed)
MEDICATION: Levothyroxine  PHARMACY:  [facility] in [place]  IS THIS A 90 DAY SUPPLY :   IS PATIENT OUT OF MEDICATION: 5 pills  IF NOT; HOW MUCH IS LEFT:   LAST APPOINTMENT DATE: @4 /16/2020  NEXT APPOINTMENT DATE:@11 /09/2018  DO WE HAVE YOUR PERMISSION TO LEAVE A DETAILED MESSAGE: yes  OTHER COMMENTS:    **Let patient know to contact pharmacy at the end of the day to make sure medication is ready. **  ** Please notify patient to allow 48-72 hours to process**  **Encourage patient to contact the pharmacy for refills or they can request refills through [facility]**

## 2019-05-24 ENCOUNTER — Ambulatory Visit: Payer: Medicare Other | Admitting: Internal Medicine

## 2019-06-21 ENCOUNTER — Other Ambulatory Visit: Payer: Self-pay

## 2019-06-22 ENCOUNTER — Encounter: Payer: Self-pay | Admitting: Internal Medicine

## 2019-06-22 ENCOUNTER — Ambulatory Visit (INDEPENDENT_AMBULATORY_CARE_PROVIDER_SITE_OTHER): Payer: Medicare Other | Admitting: Internal Medicine

## 2019-06-22 VITALS — BP 140/82 | HR 76 | Ht 60.0 in | Wt 142.0 lb

## 2019-06-22 DIAGNOSIS — R7303 Prediabetes: Secondary | ICD-10-CM

## 2019-06-22 DIAGNOSIS — E890 Postprocedural hypothyroidism: Secondary | ICD-10-CM

## 2019-06-22 LAB — LIPID PANEL
Cholesterol: 196 mg/dL (ref 0–200)
HDL: 51.3 mg/dL (ref >39.00)
LDL Cholesterol: 127 mg/dL — ABNORMAL HIGH (ref 0–99)
NonHDL: 144.55
Total CHOL/HDL Ratio: 4
Triglycerides: 88 mg/dL (ref 0.0–149.0)
VLDL: 17.6 mg/dL (ref 0.0–40.0)

## 2019-06-22 MED ORDER — LEVOTHYROXINE SODIUM 75 MCG PO TABS: 75.0000 ug | ORAL_TABLET | Freq: Every day | ORAL | 3 refills | Status: DC

## 2019-06-22 NOTE — Progress Notes (Signed)
Patient ID: Vanessa Steele, female   DOB: May 16, 1951, 68 y.o.   MRN: [id number]   This visit occurred during the SARS-CoV-2 public health emergency.  Safety protocols were in place, including screening questions prior to the visit, additional usage of staff PPE, and extensive cleaning of exam room while observing appropriate contact time as indicated for disinfecting solutions.   HPI  Vanessa Steele is a 68 y.o.-year-old female, presenting for follow-up for postablative hypothyroidism and prediabetes.  Last visit 1 year and 10 months ago.  Hypothyroidism  -Developed in 06/2018 after RAI ablation for Graves' disease.  She is on levothyroxine 75 mcg 6/7 days, taken: - in am - fasting - at least 30 min from b'fast and at least 1h from coffee + creamer - + added Ca 1 h after LT4 since last visit! - no Fe, MVI, PPIs - not on Biotin  Reviewed patient's TFTs: Lab Results  Component Value Date   TSH 3.26 04/12/2019   TSH 1.55 08/28/2017   TSH 1.26 11/24/2016   TSH 1.37 04/18/2016   FREET4 1.06 04/12/2019   FREET4 0.99 08/28/2017   FREET4 0.82 11/24/2016   FREET4 1.00 04/18/2016  11/05/2015: TSH 2.010, free T4 1.27 07/10/2015: TSH 2.59 03/10/2015: TSH 0.109, free T4 1.78 09/30/2014: TSH 0.365, free T4 1.79  Pt denies: - feeling nodules in neck - hoarseness - dysphagia - choking - SOB with lying down  She has + FH of thyroid disorders in: 2 sisters: hyper and hypothyroidism.   No FH of thyroid cancer. No h/o radiation tx to head or neck except RAI treatment in 2008.  No herbal supplements. No Biotin use. No recent steroids use.   Prediabetes:  She was on metformin 1000 mg twice a day, but stopped due to negative publicity.  We did not restart this at last visit.  Reviewed HbA1c levels: Lab Results  Component Value Date   HGBA1C 6.1 04/12/2019   HGBA1C 6.0 08/28/2017   HGBA1C 6.1 11/24/2016   HGBA1C 5.8 04/25/2016  11/12/2015: HbA1c: 6.1%  No CKD.  Latest  BUN/creatinine: Lab Results  Component Value Date   BUN 11 11/24/2016   CREATININE 0.70 11/24/2016  11/06/15: 13/0.82, eGFR 87  + HL; last Lipid panel: Lab Results  Component Value Date   CHOL 208 (H) 11/24/2016   HDL 58.40 11/24/2016   LDLCALC 119 (H) 11/24/2016   TRIG 153.0 (H) 11/24/2016   CHOLHDL 4 11/24/2016  07/10/2015: 221/87/65/139. She is not on a statin.  Last eye exam - 09/2018: No DR.  No family history of type 2 diabetes. Mother and father with HTN.  ROS: Constitutional: no weight gain/no weight loss, no fatigue, no subjective hyperthermia, no subjective hypothermia Eyes: no blurry vision, no xerophthalmia ENT: no sore throat, + see HPI Cardiovascular: no CP/no SOB/no palpitations/no leg swelling Respiratory: no cough/no SOB/no wheezing Gastrointestinal: no N/no V/no D/no C/no acid reflux Musculoskeletal: no muscle aches/no joint aches Skin: no rashes, no hair loss Neurological: no tremors/no numbness/no tingling/no dizziness  I reviewed pt's medications, allergies, PMH, social hx, family hx, and changes were documented in the history of present illness. Otherwise, unchanged from my initial visit note.  PMH: - Hypothyroidism, postablative - Prediabetes  No past surgical history.  Social History   Social History  . Marital status: Legally Separated    Spouse name: N/A  . Number of children: 0   Occupational History  . retired   Social History Main Topics  . Smoking status: Current Every Day  Smoker: 1/2-1 PPD  . Smokeless tobacco: No  . Alcohol use No  . Drug use: No   Meds: Current Outpatient Medications  Medication Sig Dispense Refill  . levothyroxine (SYNTHROID) 75 MCG tablet Take 1 tablet (75 mcg total) by mouth daily before breakfast. 6 out of 7 days 78 tablet 0   No current facility-administered medications for this visit.   - vitamin D 1000 IU daily - K 550 mg daily - Mg 400 mg daily - Centrum MVI 1 a day  NKDA   FH: - see  HPI+ HTN in M and F Heart ds in M Cancer in F  PE: BP 140/82   Pulse 76   Ht 5' (1.524 m)   Wt 142 lb (64.4 kg)   SpO2 97%   BMI 27.73 kg/m  Wt Readings from Last 3 Encounters:  06/22/19 142 lb (64.4 kg)  08/28/17 143 lb (64.9 kg)  12/18/16 140 lb 4.8 oz (63.6 kg)   Constitutional: normal weight, in NAD Eyes: PERRLA, EOMI, no exophthalmos ENT: moist mucous membranes, no thyromegaly, no cervical lymphadenopathy Cardiovascular: RRR, No MRG Respiratory: CTA B Gastrointestinal: abdomen soft, NT, ND, BS+ Musculoskeletal: no deformities, strength intact in all 4 Skin: moist, warm, no rashes Neurological: no tremor with outstretched hands, DTR normal in all 4  ASSESSMENT: 1. Hypothyroidism - postablative  2. Prediabetes  3. HL  PLAN:  1. Patient with hypothyroidism developed after RAI treatment for Graves' disease.  On levothyroxine treatment.  She returns after long absence of 1 year and 10 months. - latest thyroid labs reviewed with pt >> normal, but higher than before: Lab Results  Component Value Date   TSH 3.26 04/12/2019   - she continues on LT4 75 mcg 6 out of 7 days - pt feels good on this dose, without hypo or hyperthyroid symptoms. - we discussed about taking the thyroid hormone every day, with water, >30 minutes before breakfast, separated by >4 hours from acid reflux medications, calcium, iron, multivitamins.  She is taking it correctly except she added calcium since last visit that she takes this only 1 hour after levothyroxine>> we discussed about moving this at least 4 hours later.  Adding calcium and taking it closer to levothyroxine is the possible reason for her higher TSH at last check - RTC in 6 months  2. Prediabetes -She was previously on Metformin, which she stopped due to negative publicity.  At last visit, HbA1c was slightly better, at 6.0%, decreased from 6.1%.  At that time, we discussed about the possibility of controlling and even reversing  prediabetes with diet and we discussed about healthy dietary changes.  Months ago she had another HbA1c obtained by PCP and this was stable, at 6.1%. -She is not checking sugars at home and we discussed about buying a glucometer and checking at least once every other day, at different times of the day.  I recommended the ReliOn meter from [place]. -We discussed about CBG targets for tensions with diabetes and prediabetes - she is UTD with flu shot - UTD with eye exams -She is due for an ACR, lipid panel, and CMP. Will check these (except ACR as she just urinated and she does not think she can give [place] another urine sample) -We will see her back in 6 months  3. HL - Reviewed latest lipid panel from 2018: high LDL Lab Results  Component Value Date   CHOL 208 (H) 11/24/2016   HDL 58.40 11/24/2016   LDLCALC 119 (  H) 11/24/2016   TRIG 153.0 (H) 11/24/2016   CHOLHDL 4 11/24/2016  - Not on a statin  - will recheck today (fasting)  Patient Instructions  Please continue Levothyroxine 75 mg 6/7 days.  Take the thyroid hormone every day, with water, at least 30 minutes before breakfast, separated by at least 4 hours from: - acid reflux medications - calcium - iron - multivitamins  Move the calcium >4h after Levothyroxine.  Please get the ReliOn glucometer from [place].  Targets for blood sugars for diabetes: - before meals: 80-130 - 2h after meals: <180  Please come back for a follow-up appointment in 6 months.  Component     Latest Ref Rng & Units 06/22/2019  Glucose     65 - 99 mg/dL 80  BUN     7 - 25 mg/dL 6 (L)  Creatinine     0.50 - 0.99 mg/dL 0.72  GFR, Est Non African American     > OR = 60 mL/min/1.14m 86  GFR, Est African American     > OR = 60 mL/min/1.779m100  BUN/Creatinine Ratio     6 - 22 (calc) 8  Sodium     135 - 146 mmol/L 142  Potassium     3.5 - 5.3 mmol/L 4.4  Chloride     98 - 110 mmol/L 105  CO2     20 - 32 mmol/L 27  Calcium     8.6 - 10.4  mg/dL 9.8  Total Protein     6.1 - 8.1 g/dL 7.3  Albumin MSPROF     3.6 - 5.1 g/dL 4.1  Globulin     1.9 - 3.7 g/dL (calc) 3.2  AG Ratio     1.0 - 2.5 (calc) 1.3  Total Bilirubin     0.2 - 1.2 mg/dL 0.4  Alkaline phosphatase (APISO)     37 - 153 U/L 67  AST     10 - 35 U/L 11  ALT     6 - 29 U/L 6  Cholesterol     0 - 200 mg/dL 196  Triglycerides     0.0 - 149.0 mg/dL 88.0  HDL Cholesterol     >39.00 mg/dL 51.30  VLDL     0.0 - 40.0 mg/dL 17.6  LDL (calc)     0 - 99 mg/dL 127 (H)  Total CHOL/HDL Ratio      4  NonHDL      144.55   Labs normal with exception of a high LDL, which is increased since last check.  I would suggest addition of a statin.  We will discuss with the patient.  CrPhilemon KingdomMD PhD [facility]

## 2019-06-22 NOTE — Patient Instructions (Addendum)
Please continue Levothyroxine 75 mg 6/7 days.  Take the thyroid hormone every day, with water, at least 30 minutes before breakfast, separated by at least 4 hours from: - acid reflux medications - calcium - iron - multivitamins  Move the calcium >4h after Levothyroxine.  Please get the ReliOn glucometer from [place].  Targets for blood sugars for diabetes: - before meals: 80-130 - 2h after meals: <180  Please come back for a follow-up appointment in 6 months.

## 2019-06-23 ENCOUNTER — Other Ambulatory Visit: Payer: Self-pay | Admitting: Internal Medicine

## 2019-06-23 ENCOUNTER — Telehealth: Payer: Self-pay

## 2019-06-23 LAB — COMPLETE METABOLIC PANEL WITH GFR
AG Ratio: 1.3 (calc) (ref 1.0–2.5)
ALT: 6 U/L (ref 6–29)
AST: 11 U/L (ref 10–35)
Albumin: 4.1 g/dL (ref 3.6–5.1)
Alkaline phosphatase (APISO): 67 U/L (ref 37–153)
BUN/Creatinine Ratio: 8 (calc) (ref 6–22)
BUN: 6 mg/dL — ABNORMAL LOW (ref 7–25)
CO2: 27 mmol/L (ref 20–32)
Calcium: 9.8 mg/dL (ref 8.6–10.4)
Chloride: 105 mmol/L (ref 98–110)
Creat: 0.72 mg/dL (ref 0.50–0.99)
GFR, Est African American: 100 mL/min/{1.73_m2} (ref >=60)
GFR, Est Non African American: 86 mL/min/{1.73_m2} (ref >=60)
Globulin: 3.2 g/dL (calc) (ref 1.9–3.7)
Glucose, Bld: 80 mg/dL (ref 65–99)
Potassium: 4.4 mmol/L (ref 3.5–5.3)
Sodium: 142 mmol/L (ref 135–146)
Total Bilirubin: 0.4 mg/dL (ref 0.2–1.2)
Total Protein: 7.3 g/dL (ref 6.1–8.1)

## 2019-06-23 MED ORDER — ATORVASTATIN CALCIUM 20 MG PO TABS: 20.0000 mg | ORAL_TABLET | Freq: Every day | ORAL | 3 refills | Status: DC

## 2019-06-23 NOTE — Telephone Encounter (Signed)
Great, I just sent it.

## 2019-06-23 NOTE — Telephone Encounter (Signed)
Patient called in wanting to let Dr know it was ok to send in cholesterol medication that they talked about in Dr visit yesterday   Please call and advise if needed

## 2019-12-21 ENCOUNTER — Ambulatory Visit: Payer: Medicare Other | Admitting: Internal Medicine

## 2020-01-13 ENCOUNTER — Other Ambulatory Visit: Payer: Self-pay

## 2020-01-13 ENCOUNTER — Ambulatory Visit: Payer: Medicare Other | Admitting: Internal Medicine

## 2020-01-13 ENCOUNTER — Encounter: Payer: Self-pay | Admitting: Internal Medicine

## 2020-01-13 VITALS — BP 148/80 | HR 75 | Ht 60.0 in | Wt 147.0 lb

## 2020-01-13 DIAGNOSIS — R7303 Prediabetes: Secondary | ICD-10-CM

## 2020-01-13 DIAGNOSIS — E890 Postprocedural hypothyroidism: Secondary | ICD-10-CM

## 2020-01-13 LAB — POCT GLYCOSYLATED HEMOGLOBIN (HGB A1C): Hemoglobin A1C: 6 % — AB (ref 4.0–5.6)

## 2020-01-13 NOTE — Patient Instructions (Signed)
Please continue Levothyroxine 75 mg 6/7 days.  Take the thyroid hormone every day, with water, at least 30 minutes before breakfast, separated by at least 4 hours from: - acid reflux medications - calcium - iron - multivitamins  Please come back for a follow-up appointment in 6 months.

## 2020-01-13 NOTE — Progress Notes (Signed)
Patient ID: Vanessa Steele, female   DOB: [birth year], 69 y.o.   MRN: [id number]   This visit occurred during the SARS-CoV-2 public health emergency.  Safety protocols were in place, including screening questions prior to the visit, additional usage of staff PPE, and extensive cleaning of exam room while observing appropriate contact time as indicated for disinfecting solutions.   HPI  Vanessa Steele is a 69 y.o.-year-old female, presenting for follow-up for postablative hypothyroidism and prediabetes.  Last visit 6 months ago.  Hypothyroidism  -Developed in 06/2018 after RAI ablation for Graves' disease  She is on levothyroxine 75 mcg 6/7 days, taken: - in am - fasting - at least 30 min from b'fast and at least 1 hour from coffee and creamer - no  Fe, MVI, PPIs - At last visit I advised her to move calcium at least 4 hours after levothyroxine - not on Biotin  Reviewed her TFTs: Lab Results  Component Value Date   TSH 3.26 04/12/2019   TSH 1.55 08/28/2017   TSH 1.26 11/24/2016   TSH 1.37 04/18/2016   FREET4 1.06 04/12/2019   FREET4 0.99 08/28/2017   FREET4 0.82 11/24/2016   FREET4 1.00 04/18/2016  11/05/2015: TSH 2.010, free T4 1.27 07/10/2015: TSH 2.59 03/10/2015: TSH 0.109, free T4 1.78 09/30/2014: TSH 0.365, free T4 1.79  Pt denies: - feeling nodules in neck - hoarseness - dysphagia - choking - SOB with lying down  She has + FH of thyroid disorders in: 2 sisters: hyper and hypothyroidism.   No FH of thyroid cancer. No h/o radiation tx to head or neck except RAI treatment 2008.  No seaweed or kelp. No recent contrast studies. No herbal supplements. No Biotin use. No recent steroids use.   Prediabetes:  She was on metformin 1000 mg twice a day, but stopped due to negative publicity.  We did not have to restart this due to good control.  Reviewed HbA1c levels: Lab Results  Component Value Date   HGBA1C 6.1 04/12/2019   HGBA1C 6.0 08/28/2017   HGBA1C 6.1  11/24/2016   HGBA1C 5.8 04/25/2016  11/12/2015: HbA1c: 6.1%  No CKD.  Latest BUN/creatinine: Lab Results  Component Value Date   BUN 6 (L) 06/22/2019   BUN 11 11/24/2016   CREATININE 0.72 06/22/2019   CREATININE 0.70 11/24/2016  11/06/15: 13/0.82, eGFR 87  + HL; last Lipid panel: Lab Results  Component Value Date   CHOL 196 06/22/2019   HDL 51.30 06/22/2019   LDLCALC 127 (H) 06/22/2019   TRIG 88.0 06/22/2019   CHOLHDL 4 06/22/2019  07/10/2015: 221/87/65/139. She was not on a statin at last visit but I suggested atorvastatin 20 mg daily. She did not start but adjusted her diet.  Last eye exam -09/2018: No DR. Coming up in 02/2020.  No family history of type 2 diabetes. Mother and father with HTN.  ROS: Constitutional: no weight gain/no weight loss, no fatigue, no subjective hyperthermia, no subjective hypothermia Eyes: no blurry vision, no xerophthalmia ENT: no sore throat, no nodules palpated in neck, no dysphagia, no odynophagia, no hoarseness Cardiovascular: no CP/no SOB/no palpitations/+ leg swelling (ate potato chips yesterday) Respiratory: no cough/no SOB/no wheezing Gastrointestinal: no N/no V/no D/no C/no acid reflux Musculoskeletal: no muscle aches/no joint aches Skin: no rashes, no hair loss Neurological: no tremors/no numbness/no tingling/no dizziness  I reviewed pt's medications, allergies, PMH, social hx, family hx, and changes were documented in the history of present illness. Otherwise, unchanged from my initial visit note.  PMH: - Hypothyroidism, postablative - Prediabetes  No past surgical history.  Social History   Social History  . Marital status: Legally Separated    Spouse name: N/A  . Number of children: 0   Occupational History  . retired   Social History Main Topics  . Smoking status: Current Every Day Smoker: 1/2-1 PPD  . Smokeless tobacco: No  . Alcohol use No  . Drug use: No   Meds: Current Outpatient Medications  Medication  Sig Dispense Refill  . atorvastatin (LIPITOR) 20 MG tablet Take 1 tablet (20 mg total) by mouth daily after supper. 90 tablet 3  . levothyroxine (SYNTHROID) 75 MCG tablet Take 1 tablet (75 mcg total) by mouth daily before breakfast. 6 out of 7 days 90 tablet 3   No current facility-administered medications for this visit.  - vitamin D 1000 IU daily - K 550 mg daily - Mg 400 mg daily - Centrum MVI 1 a day  NKDA   FH: - see HPI+ HTN in M and F Heart ds in M Cancer in F  PE: BP (!) 148/80   Pulse 75   Ht 5' (1.524 m)   Wt 147 lb (66.7 kg)   SpO2 98%   BMI 28.71 kg/m  Wt Readings from Last 3 Encounters:  01/13/20 147 lb (66.7 kg)  06/22/19 142 lb (64.4 kg)  08/28/17 143 lb (64.9 kg)   Constitutional: normal weight, in NAD Eyes: PERRLA, EOMI, no exophthalmos ENT: moist mucous membranes, no thyromegaly, no cervical lymphadenopathy Cardiovascular: RRR, No MRG Respiratory: CTA B Gastrointestinal: abdomen soft, NT, ND, BS+ Musculoskeletal: no deformities, strength intact in all 4 Skin: moist, warm, no rashes Neurological: no tremor with outstretched hands, DTR normal in all 4  ASSESSMENT: 1. Hypothyroidism - postablative  2. Prediabetes  3. HL  PLAN:  1. Patient with hypothyroidism developed after RAI treatment for Graves' disease.  On levothyroxine treatment. - latest thyroid labs reviewed with pt >> normal: Lab Results  Component Value Date   TSH 3.26 04/12/2019   - she continues on LT4 75 mcg 6 out of 7 days - pt feels good on this dose. - we discussed about taking the thyroid hormone every day, with water, >30 minutes before breakfast, separated by >4 hours from acid reflux medications, calcium, iron, multivitamins. Pt. is taking it correctly now.  At last visit, she was taking calcium only 1 hour after levothyroxine and remove this later in the day. - will check thyroid tests today: TSH and fT4 - If labs are abnormal, she will need to return for repeat TFTs in  1.5 months -I will see her back in 6 months  2. Prediabetes -She was previously on Metformin, which she stopped due to negative publicity. -Her [id number] levels remain between 6.0 and 6.1% lately so we did not start her on medication. -At last visit, she was not checking her sugars and I advised her to buy the ReliOn meter from [place] and start checking at least once every other day. We discussed about CBG targets. -At this visit, HbA1c is 6.0%, (at goal) -She is up-to-date with eye exams -She is also up-to-date with her annual labs -We will see her back in 6 months  3. HL - Reviewed latest lipid panel from last OV: LDL above target: Lab Results  Component Value Date   CHOL 196 06/22/2019   HDL 51.30 06/22/2019   LDLCALC 127 (H) 06/22/2019   TRIG 88.0 06/22/2019   CHOLHDL 4 06/22/2019  -  At last visit, I advised her to start Lipitor 20 mg daily.  However, she did not start this but started work on her diet. - will recheck lipids at next visit per her request.  I strongly advised him to eliminate potato chips from her diet  Component     Latest Ref Rng & Units 01/13/2020  T4,Free(Direct)     0.8 - 1.8 ng/dL 1.2  TSH     0.40 - 4.50 mIU/L 2.40   Thyroid labs remain normal.  Philemon Kingdom, MD PhD [facility] Endocrinology

## 2020-01-14 LAB — TSH: TSH: 2.4 mIU/L (ref 0.40–4.50)

## 2020-01-14 LAB — T4, FREE: Free T4: 1.2 ng/dL (ref 0.8–1.8)

## 2020-07-03 ENCOUNTER — Ambulatory Visit: Payer: Medicare Other | Admitting: Internal Medicine

## 2020-08-03 ENCOUNTER — Other Ambulatory Visit: Payer: Self-pay

## 2020-08-07 ENCOUNTER — Ambulatory Visit: Payer: Medicare Other | Admitting: Internal Medicine

## 2020-08-30 ENCOUNTER — Encounter: Payer: Self-pay | Admitting: Internal Medicine

## 2020-08-30 ENCOUNTER — Ambulatory Visit: Payer: Medicare Other | Admitting: Internal Medicine

## 2020-08-30 ENCOUNTER — Other Ambulatory Visit: Payer: Self-pay

## 2020-08-30 VITALS — BP 120/80 | HR 71 | Ht 60.0 in | Wt 148.4 lb

## 2020-08-30 DIAGNOSIS — R7303 Prediabetes: Secondary | ICD-10-CM | POA: Diagnosis not present

## 2020-08-30 DIAGNOSIS — E890 Postprocedural hypothyroidism: Secondary | ICD-10-CM

## 2020-08-30 LAB — POCT GLYCOSYLATED HEMOGLOBIN (HGB A1C): Hemoglobin A1C: 6.2 % — AB (ref 4.0–5.6)

## 2020-08-30 MED ORDER — LEVOTHYROXINE SODIUM 75 MCG PO TABS: 75.0000 ug | ORAL_TABLET | Freq: Every day | ORAL | 3 refills | Status: DC

## 2020-08-30 NOTE — Addendum Note (Signed)
Addended by: Kenyon Ana on: 08/30/2020 02:31 PM   Modules accepted: Orders

## 2020-08-30 NOTE — Patient Instructions (Addendum)
Please continue Levothyroxine 75 mg 6/7 days.  Take the thyroid hormone every day, with water, at least 30 minutes before breakfast, separated by at least 4 hours from: - acid reflux medications - calcium - iron - multivitamins  Please stop at the lab.  Try to check blood sugars ~every other day, rotating the check times.  Targets: - before meals: 80-130 - 2h after meals: <180 (for you: <150)  Please come back for labs fasting in am.  Please come back for a follow-up appointment in 6 months.

## 2020-08-30 NOTE — Progress Notes (Signed)
Patient ID: Vanessa Steele, female   DOB: [birth year], 70 y.o.   MRN: [id number]   This visit occurred during the SARS-CoV-2 public health emergency.  Safety protocols were in place, including screening questions prior to the visit, additional usage of staff PPE, and extensive cleaning of exam room while observing appropriate contact time as indicated for disinfecting solutions.   HPI  Vanessa Steele is a 70 y.o.-year-old female, presenting for follow-up for postablative hypothyroidism and prediabetes.  Last visit 7 months ago.  Hypothyroidism  -Developed in 06/2018 after RAI ablation for Graves' disease  She is on levothyroxine 75 mcg 6 out of 7 days: - in am, around 6 am - fasting - at least 30 min from b'fast and at least 1 hour from coffee and creamer - + calcium at least 4 hours after levothyroxine (lunchtime) - no iron - no multivitamins - no PPIs - not on Biotin  Reviewed her TFTs: Lab Results  Component Value Date   TSH 2.40 01/13/2020   TSH 3.26 04/12/2019   TSH 1.55 08/28/2017   TSH 1.26 11/24/2016   TSH 1.37 04/18/2016   FREET4 1.2 01/13/2020   FREET4 1.06 04/12/2019   FREET4 0.99 08/28/2017   FREET4 0.82 11/24/2016   FREET4 1.00 04/18/2016  11/05/2015: TSH 2.010, free T4 1.27 07/10/2015: TSH 2.59 03/10/2015: TSH 0.109, free T4 1.78 09/30/2014: TSH 0.365, free T4 1.79  Pt denies: - feeling nodules in neck - hoarseness - dysphagia - choking - SOB with lying down  She has + FH of thyroid disorders in: 2 sisters: hyper and hypothyroidism.   No FH of thyroid cancer. No h/o radiation tx to head or neck except for RAI treatment in 2008.  No herbal supplements. No Biotin use. No recent steroids use.   Prediabetes:  She was on metformin 1000 mg twice a day, but stopped due to negative publicity.  We did not have to restart this due to good control  Reviewed HbA1c levels: Lab Results  Component Value Date   HGBA1C 6.0 (A) 01/13/2020   HGBA1C 6.1 04/12/2019    HGBA1C 6.0 08/28/2017   HGBA1C 6.1 11/24/2016   HGBA1C 5.8 04/25/2016  11/12/2015: HbA1c: 6.1%  No CKD.  Latest BUN/creatinine: Lab Results  Component Value Date   BUN 6 (L) 06/22/2019   BUN 11 11/24/2016   CREATININE 0.72 06/22/2019   CREATININE 0.70 11/24/2016  11/06/15: 13/0.82, eGFR 87  + HL; last Lipid panel: Lab Results  Component Value Date   CHOL 196 06/22/2019   HDL 51.30 06/22/2019   LDLCALC 127 (H) 06/22/2019   TRIG 88.0 06/22/2019   CHOLHDL 4 06/22/2019  07/10/2015: 221/87/65/139. She did not start Lipitor 20 as suggested, as she wanted to work on her diet.  Last eye exam -02/2020: No DR reportedly.  No family history of type 2 diabetes. Mother and father with HTN.  ROS: Constitutional: no weight gain/no weight loss, no fatigue, no subjective hyperthermia, no subjective hypothermia Eyes: no blurry vision, no xerophthalmia ENT: no sore throat, + see HPI Cardiovascular: no CP/no SOB/no palpitations/no leg swelling Respiratory: no cough/no SOB/no wheezing Gastrointestinal: no N/no V/no D/no C/no acid reflux Musculoskeletal: no muscle aches/no joint aches Skin: no rashes, no hair loss Neurological: no tremors/no numbness/no tingling/no dizziness  I reviewed pt's medications, allergies, PMH, social hx, family hx, and changes were documented in the history of present illness. Otherwise, unchanged from my initial visit note.  PMH: - Hypothyroidism, postablative - Prediabetes  No past surgical history.  Social History   Social History  . Marital status: Legally Separated    Spouse name: N/A  . Number of children: 0   Occupational History  . retired   Social History Main Topics  . Smoking status: Current Every Day Smoker: 1/2-1 PPD  . Smokeless tobacco: No  . Alcohol use No  . Drug use: No   Meds: Current Outpatient Medications  Medication Sig Dispense Refill  . atorvastatin (LIPITOR) 20 MG tablet Take 1 tablet (20 mg total) by mouth daily  after supper. (Patient not taking: Reported on 01/13/2020) 90 tablet 3  . levothyroxine (SYNTHROID) 75 MCG tablet Take 1 tablet (75 mcg total) by mouth daily before breakfast. 6 out of 7 days 90 tablet 3   No current facility-administered medications for this visit.  - vitamin D 1000 IU daily - K 550 mg daily - Mg 400 mg daily - Centrum MVI 1 a day  NKDA   FH: - see HPI+ HTN in M and F Heart ds in M Cancer in F  PE: BP 120/80   Pulse 71   Ht 5' (1.524 m)   Wt 148 lb 6.4 oz (67.3 kg)   SpO2 97%   BMI 28.98 kg/m  Wt Readings from Last 3 Encounters:  08/30/20 148 lb 6.4 oz (67.3 kg)  01/13/20 147 lb (66.7 kg)  06/22/19 142 lb (64.4 kg)   Constitutional: normal weight, in NAD Eyes: PERRLA, EOMI, no exophthalmos ENT: moist mucous membranes, no thyromegaly, no cervical lymphadenopathy Cardiovascular: RRR, No MRG Respiratory: CTA B Gastrointestinal: abdomen soft, NT, ND, BS+ Musculoskeletal: no deformities, strength intact in all 4 Skin: moist, warm, no rashes Neurological: no tremor with outstretched hands, DTR normal in all 4  ASSESSMENT: 1. Hypothyroidism - postablative  2. Prediabetes  3. HL  PLAN:  1. Patient with hypothyroidism developed after RAI treatment for Graves' disease.  She is on levothyroxine treatment. - latest thyroid labs reviewed with pt >> normal: Lab Results  Component Value Date   TSH 2.40 01/13/2020   - she continues on LT4 75 mcg 6 out of 7 days - pt feels good on this dose. - we discussed about taking the thyroid hormone every day, with water, >30 minutes before breakfast, separated by >4 hours from acid reflux medications, calcium, iron, multivitamins. Pt. is taking it correctly. - will check thyroid tests today: TSH and fT4 - If labs are abnormal, she will need to return for repeat TFTs in 1.5 months - I will see her back in 6 months  2. Prediabetes - She was previously on Metformin, which she stopped due to negative publicity.  We did  not need to restart this afterwards. - Her HbA1c levels are usually stable between 6.0 and 6.1% - At last visit HbA1c was at goal, at 6%, stable. - we checked her HbA1c: 6.2% (slightly higher) - At this visit, we discussed about starting to check  sugars at different times of the day - 1 every other day, rotating check times - Discussed about targets for blood sugars before and after meals - advised for yearly eye exams >> she is UTD - will check a CMP and ACR at next blood draw that she will return for this - return to clinic in 6 months  3. HL - Reviewed latest lipid panel from last OV: LDL above target: Lab Results  Component Value Date   CHOL 196 06/22/2019   HDL 51.30 06/22/2019   LDLCALC 127 (H) 06/22/2019  TRIG 88.0 06/22/2019   CHOLHDL 4 06/22/2019  -I suggested Lipitor 20 in the past but she did not start this wanting to work on diet. -She is due for another lipid panel >> I ordered this today that she will return fasting  Orders Placed This Encounter  Procedures  . TSH  . T4, free  . Lipid panel  . Comprehensive metabolic panel  . Microalbumin / creatinine urine ratio   Philemon Kingdom, MD PhD [facility] Endocrinology

## 2020-09-06 ENCOUNTER — Encounter: Payer: Self-pay | Admitting: *Deleted

## 2020-10-04 ENCOUNTER — Ambulatory Visit: Payer: Medicare Other

## 2020-12-25 ENCOUNTER — Other Ambulatory Visit: Payer: Self-pay

## 2020-12-25 ENCOUNTER — Ambulatory Visit (INDEPENDENT_AMBULATORY_CARE_PROVIDER_SITE_OTHER): Payer: Self-pay | Admitting: *Deleted

## 2020-12-25 VITALS — Ht 60.0 in | Wt 151.2 lb

## 2020-12-25 DIAGNOSIS — Z1211 Encounter for screening for malignant neoplasm of colon: Secondary | ICD-10-CM

## 2020-12-25 MED ORDER — PEG 3350-KCL-NA BICARB-NACL 420 G PO SOLR: 4000.0000 mL | Freq: Once | ORAL | 0 refills | Status: AC

## 2020-12-25 NOTE — Patient Instructions (Addendum)
Vanessa Steele   1950-08-26 MRN: [id number]    Procedure Date:  04/02/2021 Time to register: 9:00 AM Place to register: [facility] Scheduled provider: Dr. Abbey Chatters  PREPARATION FOR COLONOSCOPY WITH TRI-LYTE SPLIT PREP  Please notify us immediately if you are diabetic, take iron supplements, or if you are on Coumadin or any other blood thinners.   Please hold the following medications:  n/a  You will need to purchase 1 fleet enema and 1 box of Bisacodyl 69m tablets.   2 DAYS BEFORE PROCEDURE:  DATE:  03/31/2021  DAY: Sunday Begin clear liquid diet AFTER your lunch meal. NO SOLID FOODS after this point.  1 DAY BEFORE PROCEDURE:  DATE: 04/01/2021  DAY: Monday Continue clear liquids the entire day - NO SOLID FOOD.   Diabetic medications adjustments for today: n/a  At 2:00 pm:  Take 2 Bisacodyl tablets.   At 4:00pm:  Start drinking your solution. Make sure you mix well per instructions on the bottle. Try to drink 1 (one) 8 ounce glass every 10-15 minutes until you have consumed HALF the jug. You should complete by 6:00pm.You must keep the left over solution refrigerated until completed next day.  Continue clear liquids. You must drink plenty of clear liquids to prevent dehyration and kidney failure.     DAY OF PROCEDURE:   DATE: 04/02/2021  DAY: Tuesday If you take medications for your heart, blood pressure or breathing, you may take these medications.  Diabetic medications adjustments for today: n/a  Five hours before your procedure time @ 5:30 am:  Finish remaining amout of bowel prep, drinking 1 (one) 8 ounce glass every 10-15 minutes until complete. You have two hours to consume remaining prep.   Three hours before your procedure time @ 7:30 am:  Nothing by mouth.   At least one hour before going to the hospital:  Give yourself one Fleet enema. You may take your morning medications with sip of water unless we have instructed otherwise.      Please see below for  Dietary Information.  CLEAR LIQUIDS INCLUDE:  Water Jello (NOT red in color)   Ice Popsicles (NOT red in color)   Tea (sugar ok, no milk/cream) Powdered fruit flavored drinks  Coffee (sugar ok, no milk/cream) Gatorade/ Lemonade/ Kool-Aid  (NOT red in color)   Juice: apple, white grape, white cranberry Soft drinks  Clear bullion, consomme, broth (fat free beef/chicken/vegetable)  Carbonated beverages (any kind)  Strained chicken noodle soup Hard Candy   Remember: Clear liquids are liquids that will allow you to see your fingers on the other side of a clear glass. Be sure liquids are NOT red in color, and not cloudy, but CLEAR.  DO NOT EAT OR DRINK ANY OF THE FOLLOWING:  Dairy products of any kind   Cranberry juice Tomato juice / V8 juice   Grapefruit juice Orange juice     Red grape juice  Do not eat any solid foods, including such foods as: cereal, oatmeal, yogurt, fruits, vegetables, creamed soups, eggs, bread, crackers, pureed foods in a blender, etc.   HELPFUL HINTS FOR DRINKING PREP SOLUTION:  Make sure prep is extremely cold. Mix and refrigerate the the morning of the prep. You may also put in the freezer.  You may try mixing some Crystal Light or Country Time Lemonade if you prefer. Mix in small amounts; add more if necessary. Try drinking through a straw Rinse mouth with water or a mouthwash between glasses, to remove after-taste.  Try sipping on a cold beverage /ice/ popsicles between glasses of prep. Place a piece of sugar-free hard candy in mouth between glasses. If you become nauseated, try consuming smaller amounts, or stretch out the time between glasses. Stop for 30-60 minutes, then slowly start back drinking.        OTHER INSTRUCTIONS  You will need a responsible adult at least 70 years of age to accompany you and drive you home. This person must remain in the waiting room during your procedure. The hospital will cancel your procedure if you do not have a  responsible adult with you.   Wear loose fitting clothing that is easily removed. Leave jewelry and other valuables at home.  Remove all body piercing jewelry and leave at home. Total time from sign-in until discharge is approximately 2-3 hours. You should go home directly after your procedure and rest. You can resume normal activities the day after your procedure. The day of your procedure you should not: Drive Make legal decisions Operate machinery Drink alcohol Return to work   You may call the office (Dept: [phone]) before 5:00pm, or page the doctor on call [phone]) after 5:00pm, for further instructions, if necessary.   Insurance Information YOU WILL NEED TO CHECK WITH YOUR INSURANCE COMPANY FOR THE BENEFITS OF COVERAGE YOU HAVE FOR THIS PROCEDURE.  UNFORTUNATELY, NOT ALL INSURANCE COMPANIES HAVE BENEFITS TO COVER ALL OR PART OF THESE TYPES OF PROCEDURES.  IT IS YOUR RESPONSIBILITY TO CHECK YOUR BENEFITS, HOWEVER, WE WILL BE GLAD TO ASSIST YOU WITH ANY CODES YOUR INSURANCE COMPANY MAY NEED.    PLEASE NOTE THAT MOST INSURANCE COMPANIES WILL NOT COVER A SCREENING COLONOSCOPY FOR PEOPLE UNDER THE AGE OF 50  IF YOU HAVE BCBS INSURANCE, YOU MAY HAVE BENEFITS FOR A SCREENING COLONOSCOPY BUT IF POLYPS ARE FOUND THE DIAGNOSIS WILL CHANGE AND THEN YOU MAY HAVE A DEDUCTIBLE THAT WILL NEED TO BE MET. SO PLEASE MAKE SURE YOU CHECK YOUR BENEFITS FOR A SCREENING COLONOSCOPY AS WELL AS A DIAGNOSTIC COLONOSCOPY.

## 2020-12-25 NOTE — Progress Notes (Addendum)
Gastroenterology Pre-Procedure Review  Request Date: 12/25/2020 Requesting Physician: Dwyane Luo, PA @ [facility], Last TCS 10 years ago in [place], [place], no polyps per pt  PATIENT REVIEW QUESTIONS: The patient responded to the following health history questions as indicated:    1. Diabetes Melitis: no 2. Joint replacements in the past 12 months: no 3. Major health problems in the past 3 months: no 4. Has an artificial valve or MVP: no 5. Has a defibrillator: no 6. Has been advised in past to take antibiotics in advance of a procedure like teeth cleaning: no 7. Family history of colon cancer: no  8. Alcohol Use: no 9. Illicit drug Use: no 10. History of sleep apnea: no  11. History of coronary artery or other vascular stents placed within the last 12 months: no 12. History of any prior anesthesia complications: no 13. Body mass index is 29.53 kg/m.    MEDICATIONS & ALLERGIES:    Patient reports the following regarding taking any blood thinners:   Plavix? no Aspirin? no Coumadin? no Brilinta? no Xarelto? no Eliquis? no Pradaxa? no Savaysa? no Effient? no  Patient confirms/reports the following medications:  Current Outpatient Medications  Medication Sig Dispense Refill   levothyroxine (SYNTHROID) 75 MCG tablet Take 1 tablet (75 mcg total) by mouth daily before breakfast. 6 out of 7 days 90 tablet 3   No current facility-administered medications for this visit.    Patient confirms/reports the following allergies:  No Known Allergies  No orders of the defined types were placed in this encounter.   AUTHORIZATION INFORMATION Primary Insurance: [facility],  ID #: [id number],  Group #: [id number] Pre-Cert / Auth required: Yes, approved online [id number] Pre-Cert / Berkley Harvey #: [id number]  SCHEDULE INFORMATION: Procedure has been scheduled as follows:  Date: 02/12/2021, Time: 11:00 Location: [facility] with Dr. Marletta Lor  This Gastroenterology Pre-Precedure Review Form is being routed  to the following provider(s):  Tana Coast, PA-C

## 2021-01-02 NOTE — Progress Notes (Signed)
ASA II. Ok to schedule.

## 2021-01-03 ENCOUNTER — Other Ambulatory Visit: Payer: Self-pay | Admitting: *Deleted

## 2021-01-15 ENCOUNTER — Other Ambulatory Visit: Payer: Self-pay | Admitting: Family Medicine

## 2021-01-15 DIAGNOSIS — Z139 Encounter for screening, unspecified: Secondary | ICD-10-CM

## 2021-01-25 ENCOUNTER — Telehealth: Payer: Self-pay | Admitting: *Deleted

## 2021-01-25 NOTE — Telephone Encounter (Signed)
Pt called in and requested to cancel her procedure on 02/12/2021 due to transportation issues.  Made Vanessa Steele in Endo aware.  Pt will call us back when ready to reschedule.

## 2021-01-29 ENCOUNTER — Ambulatory Visit
Admission: RE | Admit: 2021-01-29 | Discharge: 2021-01-29 | Disposition: A | Discharge status: Home / Self Care | Payer: Medicare Other | Source: Ambulatory Visit | Attending: Family Medicine | Admitting: Family Medicine

## 2021-01-29 ENCOUNTER — Other Ambulatory Visit: Payer: Self-pay

## 2021-01-29 DIAGNOSIS — Z139 Encounter for screening, unspecified: Secondary | ICD-10-CM

## 2021-01-29 NOTE — Telephone Encounter (Signed)
Noted  

## 2021-02-12 ENCOUNTER — Ambulatory Visit (HOSPITAL_COMMUNITY): Admit: 2021-02-12 | Payer: Medicare Other

## 2021-02-12 ENCOUNTER — Encounter (HOSPITAL_COMMUNITY): Payer: Self-pay

## 2021-02-12 SURGERY — COLONOSCOPY WITH PROPOFOL
Anesthesia: Monitor Anesthesia Care

## 2021-02-19 ENCOUNTER — Telehealth: Payer: Self-pay | Admitting: Internal Medicine

## 2021-02-19 NOTE — Telephone Encounter (Signed)
[phone]  PATIENT CALLED TO RESCHEDULE HER PROCEDURE

## 2021-02-20 ENCOUNTER — Other Ambulatory Visit: Payer: Self-pay | Admitting: *Deleted

## 2021-02-20 ENCOUNTER — Telehealth: Payer: Self-pay | Admitting: Internal Medicine

## 2021-02-20 NOTE — Telephone Encounter (Signed)
Patient called again to reschedule her procedure

## 2021-02-20 NOTE — Telephone Encounter (Signed)
See other note

## 2021-02-20 NOTE — Telephone Encounter (Signed)
Spoke to pt.  She rescheduled procedure to 04/02/2021 at 10:30 with arrival at 9:00.  Pt made aware that I will mail out new prep instructions.  Pt voiced understanding.

## 2021-03-05 ENCOUNTER — Ambulatory Visit: Payer: Medicare Other | Admitting: Internal Medicine

## 2021-03-05 ENCOUNTER — Encounter: Payer: Self-pay | Admitting: Internal Medicine

## 2021-03-05 ENCOUNTER — Other Ambulatory Visit: Payer: Self-pay

## 2021-03-05 VITALS — BP 140/82 | HR 78 | Ht 60.0 in | Wt 143.4 lb

## 2021-03-05 DIAGNOSIS — E890 Postprocedural hypothyroidism: Secondary | ICD-10-CM | POA: Diagnosis not present

## 2021-03-05 DIAGNOSIS — R7303 Prediabetes: Secondary | ICD-10-CM

## 2021-03-05 DIAGNOSIS — E782 Mixed hyperlipidemia: Secondary | ICD-10-CM

## 2021-03-05 LAB — POCT GLYCOSYLATED HEMOGLOBIN (HGB A1C): Hemoglobin A1C: 5.9 % — AB (ref 4.0–5.6)

## 2021-03-05 LAB — COMPREHENSIVE METABOLIC PANEL
ALT: 9 U/L (ref 0–35)
AST: 14 U/L (ref 0–37)
Albumin: 4.3 g/dL (ref 3.5–5.2)
Alkaline Phosphatase: 71 U/L (ref 39–117)
BUN: 7 mg/dL (ref 6–23)
CO2: 28 mEq/L (ref 19–32)
Calcium: 9.7 mg/dL (ref 8.4–10.5)
Chloride: 105 mEq/L (ref 96–112)
Creatinine, Ser: 0.69 mg/dL (ref 0.40–1.20)
GFR: 87.94 mL/min (ref >60.00)
Glucose, Bld: 99 mg/dL (ref 70–99)
Potassium: 3.7 mEq/L (ref 3.5–5.1)
Sodium: 141 mEq/L (ref 135–145)
Total Bilirubin: 0.3 mg/dL (ref 0.2–1.2)
Total Protein: 8.5 g/dL — ABNORMAL HIGH (ref 6.0–8.3)

## 2021-03-05 LAB — MICROALBUMIN / CREATININE URINE RATIO
Creatinine,U: 111.6 mg/dL
Microalb Creat Ratio: 0.6 mg/g (ref 0.0–30.0)
Microalb, Ur: 0.7 mg/dL (ref 0.0–1.9)

## 2021-03-05 LAB — LIPID PANEL
Cholesterol: 228 mg/dL — ABNORMAL HIGH (ref 0–200)
HDL: 53.7 mg/dL (ref >39.00)
LDL Cholesterol: 157 mg/dL — ABNORMAL HIGH (ref 0–99)
NonHDL: 174.03
Total CHOL/HDL Ratio: 4
Triglycerides: 86 mg/dL (ref 0.0–149.0)
VLDL: 17.2 mg/dL (ref 0.0–40.0)

## 2021-03-05 LAB — TSH: TSH: 3.42 u[IU]/mL (ref 0.35–5.50)

## 2021-03-05 LAB — T4, FREE: Free T4: 0.93 ng/dL (ref 0.60–1.60)

## 2021-03-05 MED ORDER — LEVOTHYROXINE SODIUM 75 MCG PO TABS: 75.0000 ug | ORAL_TABLET | Freq: Every day | ORAL | 3 refills | Status: DC

## 2021-03-05 NOTE — Patient Instructions (Addendum)
Please continue Levothyroxine 75 mg 6/7 days.  Take the thyroid hormone every day, with water, at least 30 minutes before breakfast, separated by at least 4 hours from: - acid reflux medications - calcium - iron - multivitamins  Please stop at the lab.  Targets: - before meals: 80-130 - 2h after meals: <180 (for you: <150)  Please come back for a follow-up appointment in 1 year.

## 2021-03-05 NOTE — Progress Notes (Signed)
Patient ID: Vanessa Steele, female   DOB: 20-Nov-1950, 70 y.o.   MRN: [id number]   This visit occurred during the SARS-CoV-2 public health emergency.  Safety protocols were in place, including screening questions prior to the visit, additional usage of staff PPE, and extensive cleaning of exam room while observing appropriate contact time as indicated for disinfecting solutions.   HPI  Vanessa Steele is a 70 y.o.-year-old female, presenting for follow-up for postablative hypothyroidism and prediabetes.  Last visit 6 months ago.  Interim history: No increased urination, blurry vision, nausea, chest pain. She is eating mostly veggies and fruit, less meal. She has increased urination, she believes 2/2 drinking more water.   Hypothyroidism  -Developed in 06/2018 after RAI ablation for Graves' disease  She is on levothyroxine 75 mcg 6 out of 7 days: - in am, around 6 am - fasting - at least 30 min from b'fast and at least 1 hour from coffee and creamer - + calcium at least 4 hours after levothyroxine (lunchtime) - no iron - no multivitamins - no PPIs - not on Biotin On Turmeric.  Reviewed her TFTs: Lab Results  Component Value Date   TSH 2.40 01/13/2020   TSH 3.26 04/12/2019   TSH 1.55 08/28/2017   TSH 1.26 11/24/2016   TSH 1.37 04/18/2016   FREET4 1.2 01/13/2020   FREET4 1.06 04/12/2019   FREET4 0.99 08/28/2017   FREET4 0.82 11/24/2016   FREET4 1.00 04/18/2016  11/05/2015: TSH 2.010, free T4 1.27 07/10/2015: TSH 2.59 03/10/2015: TSH 0.109, free T4 1.78 09/30/2014: TSH 0.365, free T4 1.79  Pt denies: - feeling nodules in neck - hoarseness - dysphagia - choking - SOB with lying down  She has + FH of thyroid disorders in: 2 sisters: hyper and hypothyroidism.   No FH of thyroid cancer. No h/o radiation tx to head or neck except for RAI treatment in 2008.  No herbal supplements. No Biotin use. No recent steroids use.   Prediabetes:  She was on metformin 1000 mg twice  a day, but stopped due to negative publicity.  We did not have to restart this due to good control.  Reviewed HbA1c levels: Lab Results  Component Value Date   HGBA1C 6.2 (A) 08/30/2020   HGBA1C 6.0 (A) 01/13/2020   HGBA1C 6.1 04/12/2019   HGBA1C 6.0 08/28/2017   HGBA1C 6.1 11/24/2016   HGBA1C 5.8 04/25/2016  11/12/2015: HbA1c: 6.1%  No CKD.  Latest BUN/creatinine: Lab Results  Component Value Date   BUN 6 (L) 06/22/2019   BUN 11 11/24/2016   CREATININE 0.72 06/22/2019   CREATININE 0.70 11/24/2016  11/06/15: 13/0.82, eGFR 87  + HL; last Lipid panel: Lab Results  Component Value Date   CHOL 196 06/22/2019   HDL 51.30 06/22/2019   LDLCALC 127 (H) 06/22/2019   TRIG 88.0 06/22/2019   CHOLHDL 4 06/22/2019  07/10/2015: 221/87/65/139. She did not start Lipitor 20 as suggested, as she wanted to work on her diet.  Last eye exam -02/2020: No DR reportedly.  No family history of type 2 diabetes. Mother and father with HTN.  ROS: Constitutional: no weight gain/+ weight loss, no fatigue, no subjective hyperthermia, no subjective hypothermia Eyes: no blurry vision, no xerophthalmia ENT: no sore throat, + see HPI Cardiovascular: no CP/no SOB/no palpitations/no leg swelling Respiratory: no cough/no SOB/no wheezing Gastrointestinal: no N/no V/no D/no C/no acid reflux Musculoskeletal: no muscle aches/no joint aches Skin: no rashes, no hair loss Neurological: no tremors/no numbness/no tingling/no dizziness  I reviewed pt's medications, allergies, PMH, social hx, family hx, and changes were documented in the history of present illness. Otherwise, unchanged from my initial visit note.  PMH: - Hypothyroidism, postablative - Prediabetes  No past surgical history.  Social History   Social History   Marital status: Legally Separated    Spouse name: N/A   Number of children: 0   Occupational History   retired   Social History Main Topics   Smoking status: Current Every Day  Smoker: 1/2-1 PPD   Smokeless tobacco: No   Alcohol use No   Drug use: No   Meds: Current Outpatient Medications  Medication Sig Dispense Refill   levothyroxine (SYNTHROID) 75 MCG tablet Take 1 tablet (75 mcg total) by mouth daily before breakfast. 6 out of 7 days 90 tablet 3   No current facility-administered medications for this visit.  - vitamin D 1000 IU daily - K 550 mg daily - Mg 400 mg daily - Centrum MVI 1 a day  NKDA   FH: - see HPI+ HTN in M and F Heart ds in M Cancer in F  PE: BP 140/82 (BP Location: Right Arm, Patient Position: Sitting, Cuff Size: Normal)   Pulse 78   Ht 5' (1.524 m)   Wt 143 lb 6.4 oz (65 kg)   SpO2 96%   BMI 28.01 kg/m  Wt Readings from Last 3 Encounters:  03/05/21 143 lb 6.4 oz (65 kg)  12/25/20 151 lb 3.2 oz (68.6 kg)  08/30/20 148 lb 6.4 oz (67.3 kg)   Constitutional: normal weight, in NAD Eyes: PERRLA, EOMI, no exophthalmos ENT: moist mucous membranes, no thyromegaly, no cervical lymphadenopathy Cardiovascular: RRR, No MRG Respiratory: CTA B Gastrointestinal: abdomen soft, NT, ND, BS+ Musculoskeletal: no deformities, strength intact in all 4 Skin: moist, warm, no rashes Neurological: no tremor with outstretched hands, DTR normal in all 4  ASSESSMENT: 1. Hypothyroidism - postablative  2. Prediabetes  3. HL  PLAN:  1. Patient with hypothyroidism developed after RAI treatment for Graves' disease.  She is on levothyroxine treatment. - latest thyroid labs reviewed with pt. >> normal: Lab Results  Component Value Date   TSH 2.40 01/13/2020  - she continues on LT4 75 mcg 6 out of 7 days -she does not obtain enough tablets from the pharmacy.  She is open to increasing the dose of levothyroxine to 7 out of 7 days if needed. - pt feels good on this dose. - we discussed about taking the thyroid hormone every day, with water, >30 minutes before breakfast, separated by >4 hours from acid reflux medications, calcium, iron,  multivitamins. Pt. is taking it correctly. - will check thyroid tests today: TSH and fT4 - If labs are abnormal, she will need to return for repeat TFTs in 1.5 months  2. Prediabetes -She was previously on metformin, which she stopped due to negative publicity.  We did not need to restart it afterwards. -At last visit, HbA1c was slightly higher, at 6.2% -We discussed at that time about starting to check blood sugars.  We also discussed about targets for blood sugars before and after meals -Since last visit, she lost 5 pounds -At today's visit, HbA1c is lower, at 5.9% - advised to check sugars at different times of the day - 1x a day, rotating check times - advised for yearly eye exams >> she is UTD - return to clinic in 6 months  3. HL -Reviewed latest lipid panel from 06/2019: LDL above target, the  rest of the fractions at goal: Lab Results  Component Value Date   CHOL 196 06/22/2019   HDL 51.30 06/22/2019   LDLCALC 127 (H) 06/22/2019   TRIG 88.0 06/22/2019   CHOLHDL 4 06/22/2019  -I suggested Lipitor 20 mg daily in the past but she did not start this, wanting to work on her diet -She is due for another lipid panel - will check today (fasting)  Needs refills.  Component     Latest Ref Rng & Units 03/05/2021  Sodium     135 - 145 mEq/L 141  Potassium     3.5 - 5.1 mEq/L 3.7  Chloride     96 - 112 mEq/L 105  CO2     19 - 32 mEq/L 28  Glucose     70 - 99 mg/dL 99  BUN     6 - 23 mg/dL 7  Creatinine     0.40 - 1.20 mg/dL 0.69  Total Bilirubin     0.2 - 1.2 mg/dL 0.3  Alkaline Phosphatase     39 - 117 U/L 71  AST     0 - 37 U/L 14  ALT     0 - 35 U/L 9  Total Protein     6.0 - 8.3 g/dL 8.5 (H)  Albumin     3.5 - 5.2 g/dL 4.3  GFR     >60.00 mL/min 87.94  Calcium     8.4 - 10.5 mg/dL 9.7  Cholesterol     0 - 200 mg/dL 228 (H)  Triglycerides     0.0 - 149.0 mg/dL 86.0  HDL Cholesterol     >39.00 mg/dL 53.70  VLDL     0.0 - 40.0 mg/dL 17.2  LDL (calc)      0 - 99 mg/dL 157 (H)  Total CHOL/HDL Ratio      4  NonHDL      174.03  Microalb, Ur     0.0 - 1.9 mg/dL 0.7  Creatinine,U     mg/dL 111.6  MICROALB/CREAT RATIO     0.0 - 30.0 mg/g 0.6  T4,Free(Direct)     0.60 - 1.60 ng/dL 0.93  TSH     0.35 - 5.50 uIU/mL 3.42   Total cholesterol and LDL is quite high.  I will again recommend starting Lipitor 20 mg daily. TSH is slightly high in the normal range so we will increase the dose of levothyroxine to 75 mg daily and recheck her TFTs in 1.5 months. The rest of the labs are at goal.  Philemon Kingdom, MD PhD [facility] Endocrinology

## 2021-03-07 ENCOUNTER — Encounter: Payer: Self-pay | Admitting: Internal Medicine

## 2021-03-11 ENCOUNTER — Other Ambulatory Visit: Payer: Self-pay | Admitting: Internal Medicine

## 2021-03-11 MED ORDER — ATORVASTATIN CALCIUM 20 MG PO TABS: 20.0000 mg | ORAL_TABLET | Freq: Every day | ORAL | 3 refills | Status: DC

## 2021-04-02 ENCOUNTER — Other Ambulatory Visit: Payer: Self-pay

## 2021-04-02 ENCOUNTER — Ambulatory Visit (HOSPITAL_COMMUNITY): Payer: Medicare Other | Admitting: Anesthesiology

## 2021-04-02 ENCOUNTER — Ambulatory Visit (HOSPITAL_COMMUNITY)
Admission: RE | Admit: 2021-04-02 | Discharge: 2021-04-02 | Disposition: A | Discharge status: Home / Self Care | Payer: Medicare Other | Attending: Internal Medicine | Admitting: Internal Medicine

## 2021-04-02 ENCOUNTER — Encounter (HOSPITAL_COMMUNITY)
Admission: RE | Disposition: A | Discharge status: Home / Self Care | Payer: Self-pay | Source: Home / Self Care | Attending: Internal Medicine

## 2021-04-02 ENCOUNTER — Encounter (HOSPITAL_COMMUNITY): Payer: Self-pay

## 2021-04-02 DIAGNOSIS — K648 Other hemorrhoids: Secondary | ICD-10-CM | POA: Insufficient documentation

## 2021-04-02 DIAGNOSIS — Z8349 Family history of other endocrine, nutritional and metabolic diseases: Secondary | ICD-10-CM | POA: Diagnosis not present

## 2021-04-02 DIAGNOSIS — Z800 Family history of malignant neoplasm of digestive organs: Secondary | ICD-10-CM | POA: Diagnosis not present

## 2021-04-02 DIAGNOSIS — F17210 Nicotine dependence, cigarettes, uncomplicated: Secondary | ICD-10-CM | POA: Diagnosis not present

## 2021-04-02 DIAGNOSIS — Z1211 Encounter for screening for malignant neoplasm of colon: Secondary | ICD-10-CM | POA: Insufficient documentation

## 2021-04-02 DIAGNOSIS — Z79899 Other long term (current) drug therapy: Secondary | ICD-10-CM | POA: Insufficient documentation

## 2021-04-02 DIAGNOSIS — Z79890 Hormone replacement therapy (postmenopausal): Secondary | ICD-10-CM | POA: Diagnosis not present

## 2021-04-02 DIAGNOSIS — E079 Disorder of thyroid, unspecified: Secondary | ICD-10-CM | POA: Insufficient documentation

## 2021-04-02 HISTORY — PX: COLONOSCOPY WITH PROPOFOL: SHX5780

## 2021-04-02 SURGERY — COLONOSCOPY WITH PROPOFOL
Anesthesia: General

## 2021-04-02 SURGERY — COLONOSCOPY WITH PROPOFOL
Anesthesia: Monitor Anesthesia Care

## 2021-04-02 MED ORDER — LACTATED RINGERS IV SOLN: INTRAVENOUS | Status: DC

## 2021-04-02 MED ORDER — PROPOFOL 10 MG/ML IV BOLUS
INTRAVENOUS | Status: DC | PRN
Start: 2021-04-02 — End: 2021-04-02
  Administered 2021-04-02: 80 mg via INTRAVENOUS
  Administered 2021-04-02: 40 mg via INTRAVENOUS
  Administered 2021-04-02: 30 mg via INTRAVENOUS
  Administered 2021-04-02: 50 mg via INTRAVENOUS

## 2021-04-02 NOTE — Transfer of Care (Signed)
Immediate Anesthesia Transfer of Care Note  Patient: Vanessa Steele  Procedure(s) Performed: COLONOSCOPY WITH PROPOFOL  Patient Location: Endoscopy Unit  Anesthesia Type:General  Level of Consciousness: drowsy  Airway & Oxygen Therapy: Patient Spontanous Breathing  Post-op Assessment: Report given to RN  Post vital signs: Reviewed and stable  Last Vitals:  Vitals Value Taken Time  BP    Temp    Pulse 67 04/02/21  1024  Resp 16 04/02/21  1024  SpO2 97 04/02/21  1024    Last Pain:  Vitals:   04/02/21 1007  TempSrc:   PainSc: 0-No pain      Patients Stated Pain Goal: 7 (04/02/21 0853)  Complications: No notable events documented.

## 2021-04-02 NOTE — Anesthesia Preprocedure Evaluation (Signed)
Anesthesia Evaluation  ?Patient identified by MRN, date of birth, ID band ?Patient awake ? ? ? ?Reviewed: ?Allergy & Precautions, H&P , NPO status , Patient's Chart, lab work & pertinent test results, reviewed documented beta blocker date and time  ? ?Airway ?Mallampati: II ? ?TM Distance: >3 FB ?Neck ROM: full ? ? ? Dental ?no notable dental hx. ? ?  ?Pulmonary ?neg pulmonary ROS, Current Smoker and Patient abstained from smoking.,  ?  ?Pulmonary exam normal ?breath sounds clear to auscultation ? ? ? ? ? ? Cardiovascular ?Exercise Tolerance: Good ?negative cardio ROS ? ? ?Rhythm:regular Rate:Normal ? ? ?  ?Neuro/Psych ?negative neurological ROS ? negative psych ROS  ? GI/Hepatic ?negative GI ROS, Neg liver ROS,   ?Endo/Other  ?Hypothyroidism  ? Renal/GU ?negative Renal ROS  ?negative genitourinary ?  ?Musculoskeletal ? ? Abdominal ?  ?Peds ? Hematology ?negative hematology ROS ?(+)   ?Anesthesia Other Findings ? ? Reproductive/Obstetrics ?negative OB ROS ? ?  ? ? ? ? ? ? ? ? ? ? ? ? ? ?  ?  ? ? ? ? ? ? ? ? ?Anesthesia Physical ?Anesthesia Plan ? ?ASA: 2 ? ?Anesthesia Plan: General  ? ?Post-op Pain Management:   ? ?Induction:  ? ?PONV Risk Score and Plan: Propofol infusion ? ?Airway Management Planned:  ? ?Additional Equipment:  ? ?Intra-op Plan:  ? ?Post-operative Plan:  ? ?Informed Consent: I have reviewed the patients History and Physical, chart, labs and discussed the procedure including the risks, benefits and alternatives for the proposed anesthesia with the patient or authorized representative who has indicated his/her understanding and acceptance.  ? ? ? ?Dental Advisory Given ? ?Plan Discussed with: CRNA ? ?Anesthesia Plan Comments:   ? ? ? ? ? ? ?Anesthesia Quick Evaluation ? ?

## 2021-04-02 NOTE — Anesthesia Postprocedure Evaluation (Signed)
Anesthesia Post Note  Patient: Vanessa Steele  Procedure(s) Performed: COLONOSCOPY WITH PROPOFOL  Patient location during evaluation: Phase II Anesthesia Type: General Level of consciousness: awake Pain management: pain level controlled Vital Signs Assessment: post-procedure vital signs reviewed and stable Respiratory status: spontaneous breathing and respiratory function stable Cardiovascular status: blood pressure returned to baseline and stable Postop Assessment: no headache and no apparent nausea or vomiting Anesthetic complications: no Comments: Late entry   No notable events documented.   Last Vitals:  Vitals:   04/02/21 0903 04/02/21 1025  BP: (!) 167/73 (!) 128/49  Pulse: 76   Resp: 14 15  Temp: 36.9 C 36.9 C  SpO2: 98% 99%    Last Pain:  Vitals:   04/02/21 1025  TempSrc: Oral  PainSc: 0-No pain                 Windell Norfolk

## 2021-04-02 NOTE — H&P (Signed)
Primary Care Physician:  Assunta Found, MD Primary Gastroenterologist:  Dr. Marletta Lor  Pre-Procedure History & Physical: HPI:  Vanessa Steele is a 70 y.o. female is here for a colonoscopy for colon cancer screening purposes. Last TCS 10 years ago in [place], [place], no polyps per pt Patient denies any family history of colorectal cancer.  No melena or hematochezia.  No abdominal pain or unintentional weight loss.  No change in bowel habits.  Overall feels well from a GI standpoint.  Past Medical History:  Diagnosis Date   Thyroid disease     History reviewed. No pertinent surgical history.  Prior to Admission medications   Medication Sig Start Date End Date Taking? Authorizing Provider  atorvastatin (LIPITOR) 20 MG tablet Take 1 tablet (20 mg total) by mouth daily. 03/11/21  Yes Carlus Pavlov, MD  levothyroxine (SYNTHROID) 75 MCG tablet Take 1 tablet (75 mcg total) by mouth daily before breakfast. 03/05/21  Yes Carlus Pavlov, MD  diclofenac Sodium (VOLTAREN) 1 % GEL Apply 1 application topically 2 (two) times daily as needed (pain).    [provider]    Allergies as of 02/20/2021   (No Known Allergies)    Family History  Problem Relation Age of Onset   Heart attack Paternal Grandfather    CAD Paternal Grandfather    Dementia Paternal Grandmother    Depression Father    Colon cancer Father    Hypertension Father    Hypertension Mother    Heart failure Mother    Sickle cell trait Brother    Colon cancer Brother    Hypothyroidism Sister    Hyperlipidemia Sister     Social History   Socioeconomic History   Marital status: Divorced    Spouse name: Not on file   Number of children: Not on file   Years of education: Not on file   Highest education level: Not on file  Occupational History   Not on file  Tobacco Use   Smoking status: Every Day    Packs/day: 0.50    Types: Cigarettes   Smokeless tobacco: Never  Vaping Use   Vaping Use: Never used  Substance  and Sexual Activity   Alcohol use: No   Drug use: No   Sexual activity: Not on file  Other Topics Concern   Not on file  Social History Narrative   Not on file   Social Determinants of Health   Financial Resource Strain: Not on file  Food Insecurity: Not on file  Transportation Needs: Not on file  Physical Activity: Not on file  Stress: Not on file  Social Connections: Not on file  Intimate Partner Violence: Not on file    Review of Systems: See HPI, otherwise negative ROS  Physical Exam: Vital signs in last 24 hours: Temp:  [98.5 F (36.9 C)] 98.5 F (36.9 C) (09/13 0903) Pulse Rate:  [76] 76 (09/13 0903) Resp:  [14] 14 (09/13 0903) BP: (167)/(73) 167/73 (09/13 0903) SpO2:  [98 %] 98 % (09/13 0903) Weight:  [64.4 kg] 64.4 kg (09/13 0853)   General:   Alert,  Well-developed, well-nourished, pleasant and cooperative in NAD Head:  Normocephalic and atraumatic. Eyes:  Sclera clear, no icterus.   Conjunctiva pink. Ears:  Normal auditory acuity. Nose:  No deformity, discharge,  or lesions. Mouth:  No deformity or lesions, dentition normal. Neck:  Supple; no masses or thyromegaly. Lungs:  Clear throughout to auscultation.   No wheezes, crackles, or rhonchi. No acute distress. Heart:  Regular rate and rhythm; no murmurs, clicks, rubs,  or gallops. Abdomen:  Soft, nontender and nondistended. No masses, hepatosplenomegaly or hernias noted. Normal bowel sounds, without guarding, and without rebound.   Msk:  Symmetrical without gross deformities. Normal posture. Extremities:  Without clubbing or edema. Neurologic:  Alert and  oriented x4;  grossly normal neurologically. Skin:  Intact without significant lesions or rashes. Cervical Nodes:  No significant cervical adenopathy. Psych:  Alert and cooperative. Normal mood and affect.  Impression/Plan: Vanessa Steele is here for a colonoscopy to be performed for colon cancer screening purposes.  The risks of the procedure  including infection, bleed, or perforation as well as benefits, limitations, alternatives and imponderables have been reviewed with the patient. Questions have been answered. All parties agreeable.

## 2021-04-02 NOTE — Op Note (Signed)
[facility] Patient Name: Vanessa Steele Procedure Date: 04/02/2021 10:00 AM MRN: [id number] Date of Birth: [birth year] Attending MD: Elon Alas. Abbey Chatters DO CSN: [id number] Age: 70 Admit Type: Outpatient Procedure:                Colonoscopy Indications:              Screening for colorectal malignant neoplasm Providers:                Elon Alas. Abbey Chatters, DO, Janeece Riggers, RN, Raphael Gibney, Technician Referring MD:              Medicines:                See the Anesthesia note for documentation of the                            administered medications Complications:            No immediate complications. Estimated Blood Loss:     Estimated blood loss: none. Procedure:                Pre-Anesthesia Assessment:                           - The anesthesia plan was to use monitored                            anesthesia care (MAC).                           After obtaining informed consent, the colonoscope                            was passed under direct vision. Throughout the                            procedure, the patient's blood pressure, pulse, and                            oxygen saturations were monitored continuously. The                            [id number]-HQ190L ([id number]) scope was introduced through                            the anus and advanced to the the cecum, identified                            by appendiceal orifice and ileocecal valve. The                            colonoscopy was performed without difficulty. The                            patient tolerated the procedure well. The quality  of the bowel preparation was evaluated using the                            BBPS [facility] Bowel Preparation Scale) with scores                            of: Right Colon = 3, Transverse Colon = 3 and Left                            Colon = 3 (entire mucosa seen well with no residual                            staining, small  fragments of stool or opaque                            liquid). The total BBPS score equals 9. Scope In: 10:10:02 AM Scope Out: 10:21:48 AM Scope Withdrawal Time: 0 hours 6 minutes 12 seconds  Total Procedure Duration: 0 hours 11 minutes 46 seconds  Findings:      The perianal and digital rectal examinations were normal.      Non-bleeding internal hemorrhoids were found during retroflexion.      The entire examined colon appeared normal. Impression:               - Non-bleeding internal hemorrhoids.                           - The entire examined colon is normal.                           - No specimens collected. Moderate Sedation:      Per Anesthesia Care Recommendation:           - Patient has a contact number available for                            emergencies. The signs and symptoms of potential                            delayed complications were discussed with the                            patient. Return to normal activities tomorrow.                            Written discharge instructions were provided to the                            patient.                           - Resume previous diet.                           - Continue present medications.                           -  Repeat colonoscopy in 10 years for screening                            purposes.                           - Return to GI clinic PRN. Procedure Code(s):        --- Professional ---                           G9021, Colorectal cancer screening; colonoscopy on                            individual not meeting criteria for high risk Diagnosis Code(s):        --- Professional ---                           Z12.11, Encounter for screening for malignant                            neoplasm of colon                           K64.8, Other hemorrhoids CPT copyright 2019 American Medical Association. All rights reserved. The codes documented in this report are preliminary and upon coder review may  be  revised to meet current compliance requirements. Elon Alas. Abbey Chatters, DO [place] Abbey Chatters, DO 04/02/2021 11:07:10 AM This report has been signed electronically. Number of Addenda: 0

## 2021-04-02 NOTE — Discharge Instructions (Addendum)

## 2021-04-09 ENCOUNTER — Encounter (HOSPITAL_COMMUNITY): Payer: Self-pay | Admitting: Internal Medicine

## 2021-06-03 ENCOUNTER — Telehealth: Payer: Self-pay | Admitting: Internal Medicine

## 2021-06-03 NOTE — Telephone Encounter (Signed)
Called and provided authorization for Manufacturer change. Pharmacy advised Dr Elvera Lennox gives authorization for all of her pts regarding manufacturer change.

## 2021-06-03 NOTE — Telephone Encounter (Signed)
Pharmacy called to request a verbal authorization to change manufacturer for Levothyroxine - please call pharmacy after 9a at [phone]

## 2021-08-30 ENCOUNTER — Other Ambulatory Visit: Payer: Self-pay

## 2021-08-30 DIAGNOSIS — E890 Postprocedural hypothyroidism: Secondary | ICD-10-CM

## 2021-08-30 MED ORDER — LEVOTHYROXINE SODIUM 75 MCG PO TABS: 75.0000 ug | ORAL_TABLET | Freq: Every day | ORAL | 1 refills | Status: DC

## 2021-08-30 NOTE — Telephone Encounter (Signed)
Pt called requesting a rx refill.

## 2021-12-24 ENCOUNTER — Other Ambulatory Visit: Payer: Self-pay | Admitting: Family Medicine

## 2021-12-24 DIAGNOSIS — Z1231 Encounter for screening mammogram for malignant neoplasm of breast: Secondary | ICD-10-CM

## 2022-02-20 ENCOUNTER — Other Ambulatory Visit: Payer: Self-pay | Admitting: Internal Medicine

## 2022-02-20 DIAGNOSIS — E890 Postprocedural hypothyroidism: Secondary | ICD-10-CM

## 2022-03-03 ENCOUNTER — Ambulatory Visit
Admission: RE | Admit: 2022-03-03 | Discharge: 2022-03-03 | Disposition: A | Discharge status: Home / Self Care | Payer: Medicare Other | Source: Ambulatory Visit | Attending: Family Medicine | Admitting: Family Medicine

## 2022-03-03 DIAGNOSIS — Z1231 Encounter for screening mammogram for malignant neoplasm of breast: Secondary | ICD-10-CM

## 2022-03-05 ENCOUNTER — Encounter: Payer: Self-pay | Admitting: Internal Medicine

## 2022-03-05 ENCOUNTER — Ambulatory Visit: Payer: Medicare Other | Admitting: Internal Medicine

## 2022-03-05 VITALS — BP 130/86 | HR 77 | Ht 60.0 in | Wt 147.6 lb

## 2022-03-05 DIAGNOSIS — E782 Mixed hyperlipidemia: Secondary | ICD-10-CM

## 2022-03-05 DIAGNOSIS — R7303 Prediabetes: Secondary | ICD-10-CM | POA: Diagnosis not present

## 2022-03-05 DIAGNOSIS — E890 Postprocedural hypothyroidism: Secondary | ICD-10-CM

## 2022-03-05 LAB — POCT GLYCOSYLATED HEMOGLOBIN (HGB A1C): Hemoglobin A1C: 6 % — AB (ref 4.0–5.6)

## 2022-03-05 LAB — COMPREHENSIVE METABOLIC PANEL
ALT: 7 U/L (ref 0–35)
AST: 13 U/L (ref 0–37)
Albumin: 4.1 g/dL (ref 3.5–5.2)
Alkaline Phosphatase: 69 U/L (ref 39–117)
BUN: 6 mg/dL (ref 6–23)
CO2: 25 mEq/L (ref 19–32)
Calcium: 9.4 mg/dL (ref 8.4–10.5)
Chloride: 105 mEq/L (ref 96–112)
Creatinine, Ser: 0.67 mg/dL (ref 0.40–1.20)
GFR: 87.95 mL/min (ref >60.00)
Glucose, Bld: 83 mg/dL (ref 70–99)
Potassium: 3.4 mEq/L — ABNORMAL LOW (ref 3.5–5.1)
Sodium: 141 mEq/L (ref 135–145)
Total Bilirubin: 0.3 mg/dL (ref 0.2–1.2)
Total Protein: 7.6 g/dL (ref 6.0–8.3)

## 2022-03-05 LAB — LIPID PANEL
Cholesterol: 201 mg/dL — ABNORMAL HIGH (ref 0–200)
HDL: 51.9 mg/dL (ref >39.00)
LDL Cholesterol: 131 mg/dL — ABNORMAL HIGH (ref 0–99)
NonHDL: 149.14
Total CHOL/HDL Ratio: 4
Triglycerides: 92 mg/dL (ref 0.0–149.0)
VLDL: 18.4 mg/dL (ref 0.0–40.0)

## 2022-03-05 LAB — MICROALBUMIN / CREATININE URINE RATIO
Creatinine,U: 94.4 mg/dL
Microalb Creat Ratio: 0.7 mg/g (ref 0.0–30.0)
Microalb, Ur: <0.7 mg/dL (ref 0.0–1.9)

## 2022-03-05 LAB — TSH: TSH: 1.06 u[IU]/mL (ref 0.35–5.50)

## 2022-03-05 LAB — T4, FREE: Free T4: 1.25 ng/dL (ref 0.60–1.60)

## 2022-03-05 MED ORDER — ATORVASTATIN CALCIUM 20 MG PO TABS: 20.0000 mg | ORAL_TABLET | Freq: Every day | ORAL | 3 refills | Status: AC

## 2022-03-05 NOTE — Patient Instructions (Signed)
Please continue Levothyroxine 75 mcg daily.  Take the thyroid hormone every day, with water, at least 30 minutes before breakfast, separated by at least 4 hours from: - acid reflux medications - calcium - iron - multivitamins  Please stop at the lab.  Targets: - before meals: 80-130 - 2h after meals: <180 (for you: <150)  Please come back for a follow-up appointment in 1 year.

## 2022-03-05 NOTE — Progress Notes (Signed)
Patient ID: Vanessa Steele, female   DOB: 07/06/1951, 71 y.o.   MRN: [id number]   HPI  Vanessa Steele is a 71 y.o.-year-old female, presenting for follow-up for postablative hypothyroidism and prediabetes.  Last visit 1 year ago.  Interim history: No increased urination, blurry vision, nausea, chest pain. She had all her teeth pulled in 10/2021 >> eating only pureed foods.She will have dentured done in 07/2022.  She has been eating more carbs.  Hypothyroidism  -Developed in 06/2018 after RAI ablation for Graves' disease  She is on levothyroxine 75 mcg daily (dose increased at last visit) - in am, around 6 am - fasting - at least 1h  from b'fast and coffee and creamer - + calcium at least 4 hours after levothyroxine (lunchtime) - no iron - no multivitamins - no PPIs - not on Biotin On Turmeric.  Reviewed her TFTs:   >> She did not return for repeat labs after we increased her levothyroxine dose at last visit: Lab Results  Component Value Date   TSH 3.42 03/05/2021   TSH 2.40 01/13/2020   TSH 3.26 04/12/2019   TSH 1.55 08/28/2017   TSH 1.26 11/24/2016   TSH 1.37 04/18/2016   FREET4 0.93 03/05/2021   FREET4 1.2 01/13/2020   FREET4 1.06 04/12/2019   FREET4 0.99 08/28/2017   FREET4 0.82 11/24/2016   FREET4 1.00 04/18/2016  11/05/2015: TSH 2.010, free T4 1.27 07/10/2015: TSH 2.59 03/10/2015: TSH 0.109, free T4 1.78 09/30/2014: TSH 0.365, free T4 1.79  Pt denies: - feeling nodules in neck - hoarseness - dysphagia - choking  She has + FH of thyroid disorders in: 2 sisters: hyper and hypothyroidism.   No FH of thyroid cancer. No h/o radiation tx to head or neck except for RAI treatment in 2008.  Prediabetes:  She was on metformin 1000 mg twice a day, but stopped due to negative publicity.  We did not have to restart this due to good control.  She checks sugars 1x a day: - am: 140-150 - before lunch: 130-140  Reviewed HbA1c levels: Lab Results  Component Value  Date   HGBA1C 5.9 (A) 03/05/2021   HGBA1C 6.2 (A) 08/30/2020   HGBA1C 6.0 (A) 01/13/2020   HGBA1C 6.1 04/12/2019   HGBA1C 6.0 08/28/2017   HGBA1C 6.1 11/24/2016   HGBA1C 5.8 04/25/2016  11/12/2015: HbA1c: 6.1%  No CKD.  Latest BUN/creatinine: Lab Results  Component Value Date   BUN 7 03/05/2021   BUN 6 (L) 06/22/2019   CREATININE 0.69 03/05/2021   CREATININE 0.72 06/22/2019  11/06/15: 13/0.82, eGFR 87  + HL; last Lipid panel: Lab Results  Component Value Date   CHOL 228 (H) 03/05/2021   HDL 53.70 03/05/2021   LDLCALC 157 (H) 03/05/2021   TRIG 86.0 03/05/2021   CHOLHDL 4 03/05/2021  07/10/2015: 221/87/65/139. She did not start Lipitor 20 as suggested, as she wanted to work on her diet. Now taking it inconsistently, 1x a week.  Last eye exam -10/2021: No DR reportedly.  No family history of type 2 diabetes. Mother and father with HTN.  ROS:  + see HPI  I reviewed pt's medications, allergies, PMH, social hx, family hx, and changes were documented in the history of present illness. Otherwise, unchanged from my initial visit note.  PMH: - Hypothyroidism, postablative - Prediabetes  No past surgical history.  Social History   Social History   Marital status: Legally Separated    Spouse name: N/A   Number of children: 0  Occupational History   retired   Social History Main Topics   Smoking status: Current Every Day Smoker: 1/2-1 PPD   Smokeless tobacco: No   Alcohol use No   Drug use: No   Meds: Current Outpatient Medications  Medication Sig Dispense Refill   atorvastatin (LIPITOR) 20 MG tablet Take 1 tablet (20 mg total) by mouth daily. 90 tablet 3   diclofenac Sodium (VOLTAREN) 1 % GEL Apply 1 application topically 2 (two) times daily as needed (pain).     levothyroxine (SYNTHROID) 75 MCG tablet TAKE 1 TABLET BY MOUTH ONCE DAILY BEFORE BREAKFAST 90 tablet 0   No current facility-administered medications for this visit.  - vitamin D 1000 IU daily - K  550 mg daily - Mg 400 mg daily - Centrum MVI 1 a day  NKDA   FH: - see HPI+ HTN in M and F Heart ds in M Cancer in F  PE: BP 130/86 (BP Location: Right Arm, Patient Position: Sitting, Cuff Size: Normal)   Pulse 77   Ht 5' (1.524 m)   Wt 147 lb 9.6 oz (67 kg)   SpO2 99%   BMI 28.83 kg/m  Wt Readings from Last 3 Encounters:  03/05/22 147 lb 9.6 oz (67 kg)  04/02/21 142 lb (64.4 kg)  03/05/21 143 lb 6.4 oz (65 kg)   Constitutional: normal weight, in NAD Eyes: EOMI, no exophthalmos ENT: moist mucous membranes, no thyromegaly, no cervical lymphadenopathy Cardiovascular: RRR, No MRG Respiratory: CTA B Musculoskeletal: no deformities Skin: moist, warm, no rashes Neurological: no tremor with outstretched hands  ASSESSMENT: 1. Hypothyroidism - postablative  2. Prediabetes  3. HL  PLAN:  1. Patient with hypothyroidism developed after RAI treatment for Graves' disease.  She is on levothyroxine treatment. - latest thyroid labs reviewed with pt. >> normal: Lab Results  Component Value Date   TSH 3.42 03/05/2021  - she continues on LT4 75 mcg daily - pt feels good on this dose. - we discussed about taking the thyroid hormone every day, with water, >30 minutes before breakfast, separated by >4 hours from acid reflux medications, calcium, iron, multivitamins. Pt. is taking it correctly. - will check thyroid tests today: TSH and fT4 -we discussed that if we end up changing the dose of levothyroxine, she needs to return sooner for labs, usually after 5 to 6 weeks - If labs are abnormal, she will need to return for repeat TFTs in 1.5 months  2. Prediabetes -She was previously on metformin, which she stopped due to negative obesity.  We did not restart it afterwards. -At last visit HbA1c was lower, at 5.9%.  HbA1c level today: 6.0% (higher) -I advised her to check her sugars at different times of the day, once a day, rotating check times -sugars at home appear to be slightly  higher than I would expect for his HbA1c.  I advised her to make sure that her test tubes are not old. -After for yearly eye exams: She is up-to-date. - return to clinic in 1 year  3. HL -Reviewed latest lipid panel from 02/2021: LDL above target: Lab Results  Component Value Date   CHOL 228 (H) 03/05/2021   HDL 53.70 03/05/2021   LDLCALC 157 (H) 03/05/2021   TRIG 86.0 03/05/2021   CHOLHDL 4 03/05/2021  -I suggested Lipitor 20 mg daily  -but actually taking it once a week -She is due for another lipid panel, will check today  Component     Latest Ref Rng  03/05/2022  TSH     0.35 - 5.50 uIU/mL 1.06   T4,Free(Direct)     0.60 - 1.60 ng/dL 1.25   Cholesterol     0 - 200 mg/dL 201 (H)   Triglycerides     0.0 - 149.0 mg/dL 92.0   HDL Cholesterol     >39.00 mg/dL 51.90   VLDL     0.0 - 40.0 mg/dL 18.4   LDL (calc)     0 - 99 mg/dL 131 (H)   Total CHOL/HDL Ratio 4   NonHDL 149.14   Glucose     70 - 99 mg/dL 83   BUN     6 - 23 mg/dL 6   Creatinine     0.40 - 1.20 mg/dL 0.67   Sodium     135 - 145 mEq/L 141   Potassium     3.5 - 5.1 mEq/L 3.4 (L)   Chloride     96 - 112 mEq/L 105   CO2     19 - 32 mEq/L 25   Calcium     8.4 - 10.5 mg/dL 9.4   Total Protein     6.0 - 8.3 g/dL 7.6   Total Bilirubin     0.2 - 1.2 mg/dL 0.3   AST     0 - 37 U/L 13   ALT     0 - 35 U/L 7   Microalb, Ur     0.0 - 1.9 mg/dL <0.7   Creatinine,U     mg/dL 94.4   MICROALB/CREAT RATIO     0.0 - 30.0 mg/g 0.7   Alkaline Phosphatase     39 - 117 U/L 69   Albumin     3.5 - 5.2 g/dL 4.1   GFR     >60.00 mL/min 87.95    Potassium very slightly low. ACR normal.  GFR normal.  TFTs normal. LDL is elevated.  I will advise her to take her Lipitor daily.  Philemon Kingdom, MD PhD [facility]

## 2022-04-08 ENCOUNTER — Ambulatory Visit (HOSPITAL_COMMUNITY): Payer: Medicare Other | Admitting: Physical Therapy

## 2022-05-15 ENCOUNTER — Other Ambulatory Visit: Payer: Self-pay | Admitting: Internal Medicine

## 2022-05-15 DIAGNOSIS — E890 Postprocedural hypothyroidism: Secondary | ICD-10-CM

## 2022-08-19 ENCOUNTER — Other Ambulatory Visit: Payer: Self-pay | Admitting: Internal Medicine

## 2022-08-19 DIAGNOSIS — E890 Postprocedural hypothyroidism: Secondary | ICD-10-CM

## 2022-09-26 IMAGING — MG MM DIGITAL SCREENING BILAT W/ TOMO AND CAD
8 series · 8 of 24 positions shown · non-contrast
Comparison: Previous exam(s).

CLINICAL DATA: Screening.

EXAM:
DIGITAL SCREENING BILATERAL MAMMOGRAM WITH TOMOSYNTHESIS AND CAD
TECHNIQUE: Bilateral screening digital craniocaudal and mediolateral oblique
mammograms were obtained. Bilateral screening digital breast
tomosynthesis was performed. The images were evaluated with
computer-aided detection.

[L MLO synth-2D]
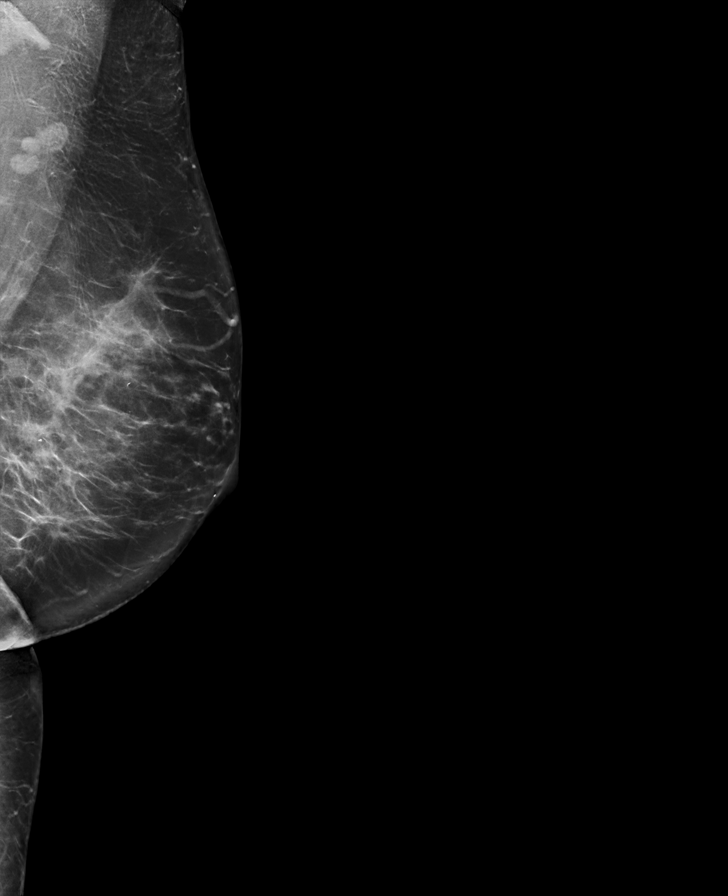

[R MLO synth-2D]
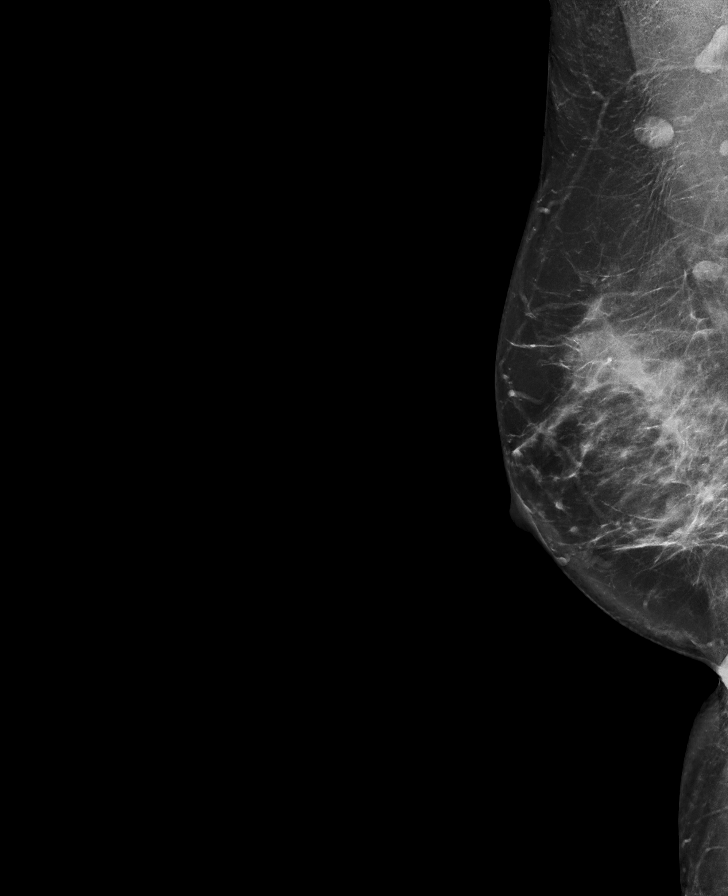

[R CC synth-2D]
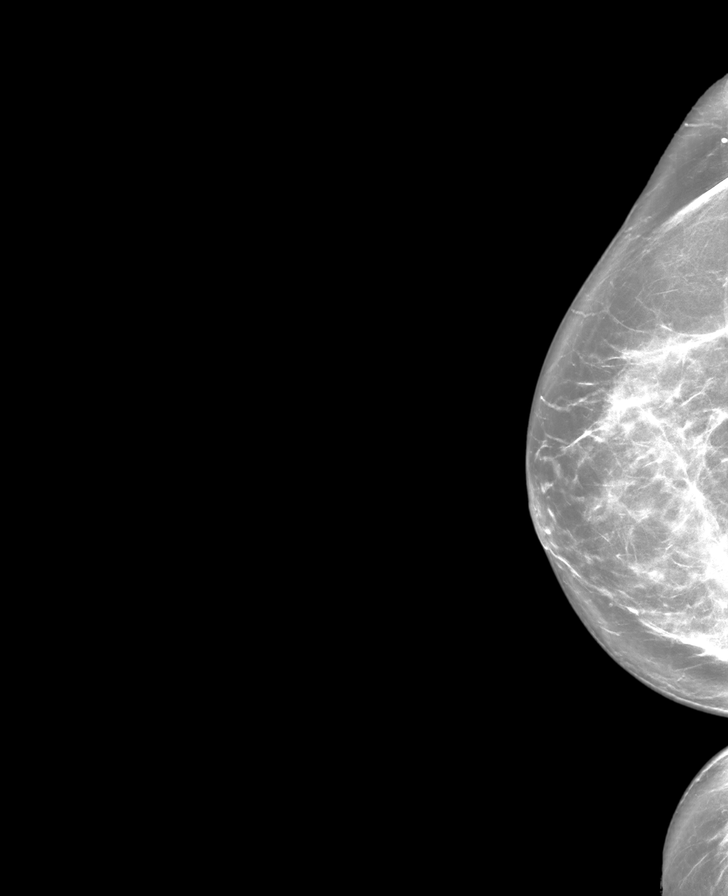

[L CC synth-2D]
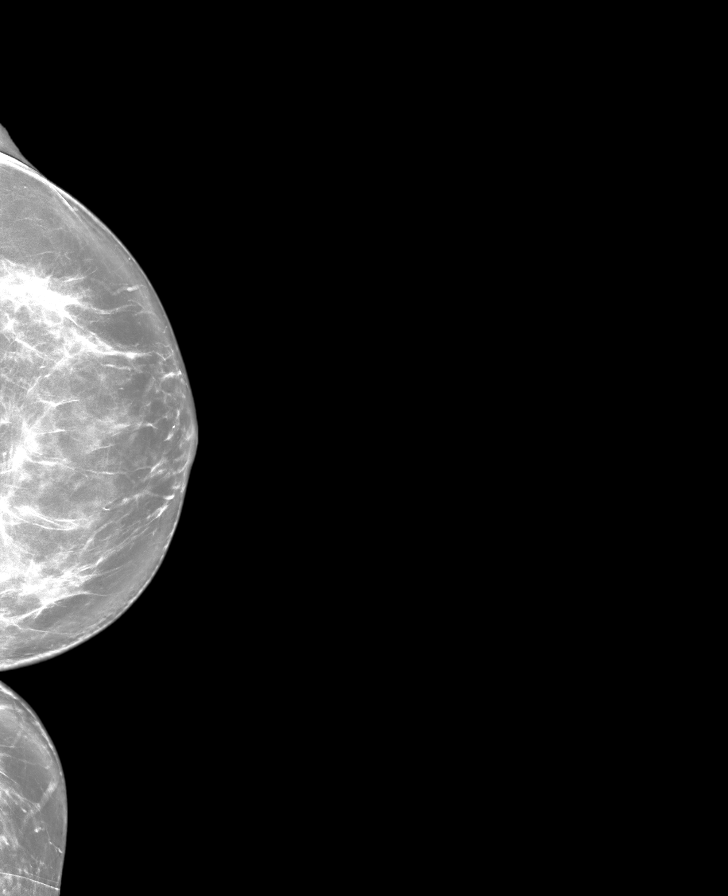

[R CC tomo · tomo slice 45/88.0]
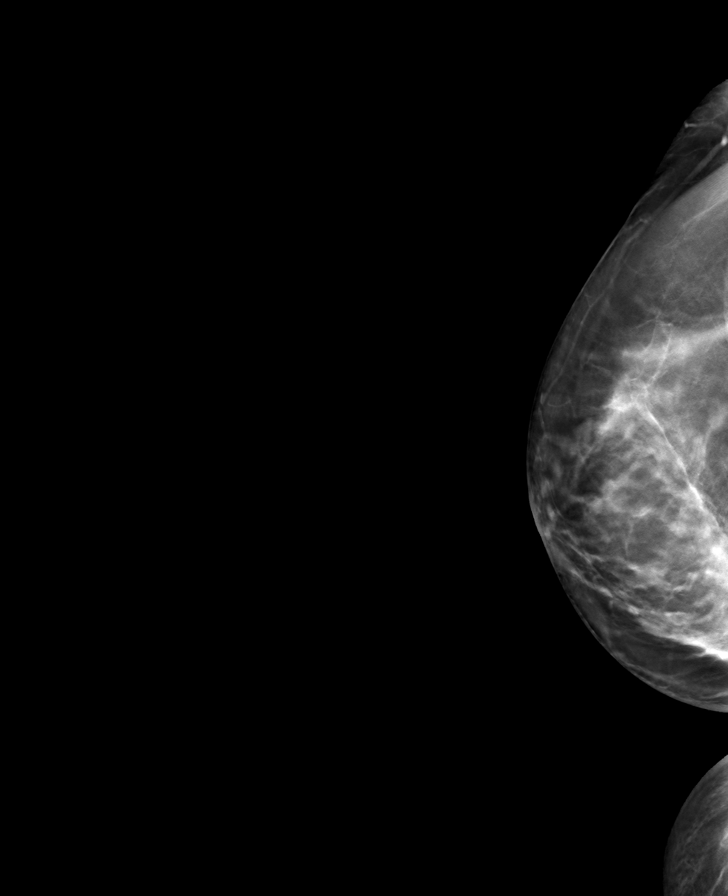

[L MLO tomo · tomo slice 46/91.0]
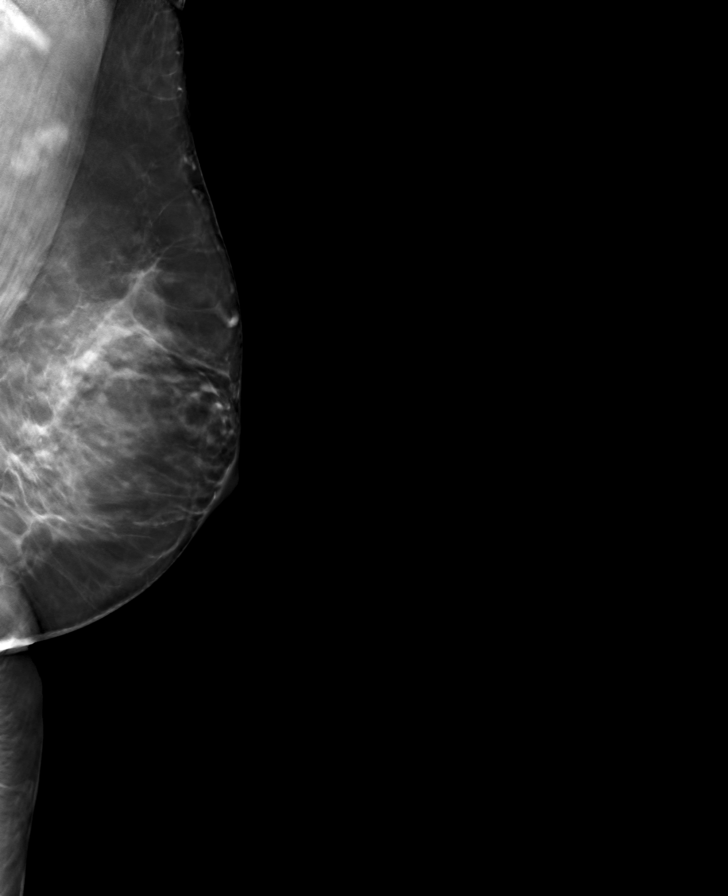

[L CC tomo · tomo slice 42/83.0]
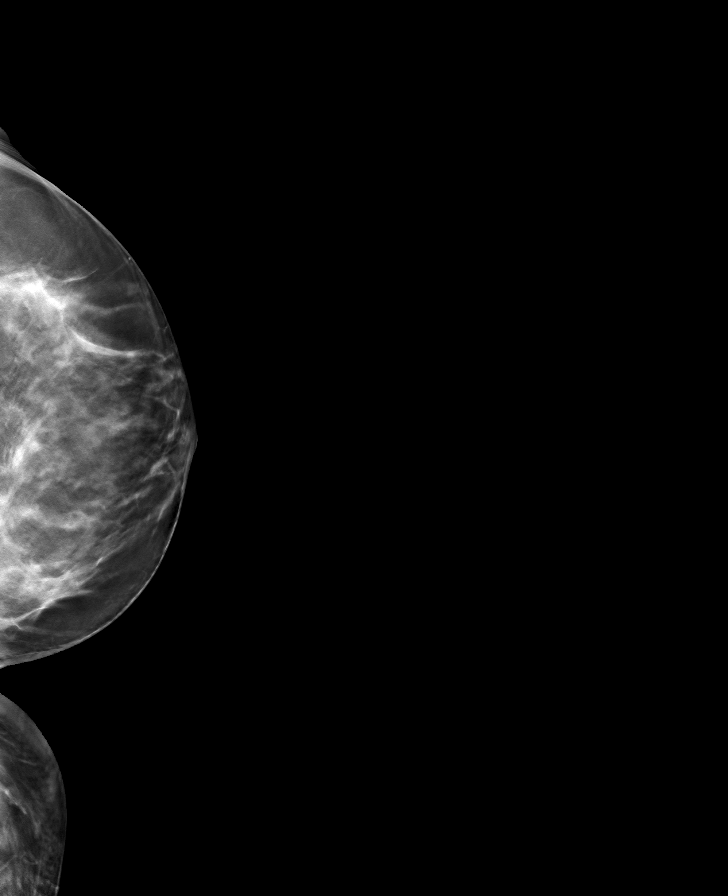

[R MLO tomo · tomo slice 43/85.0]
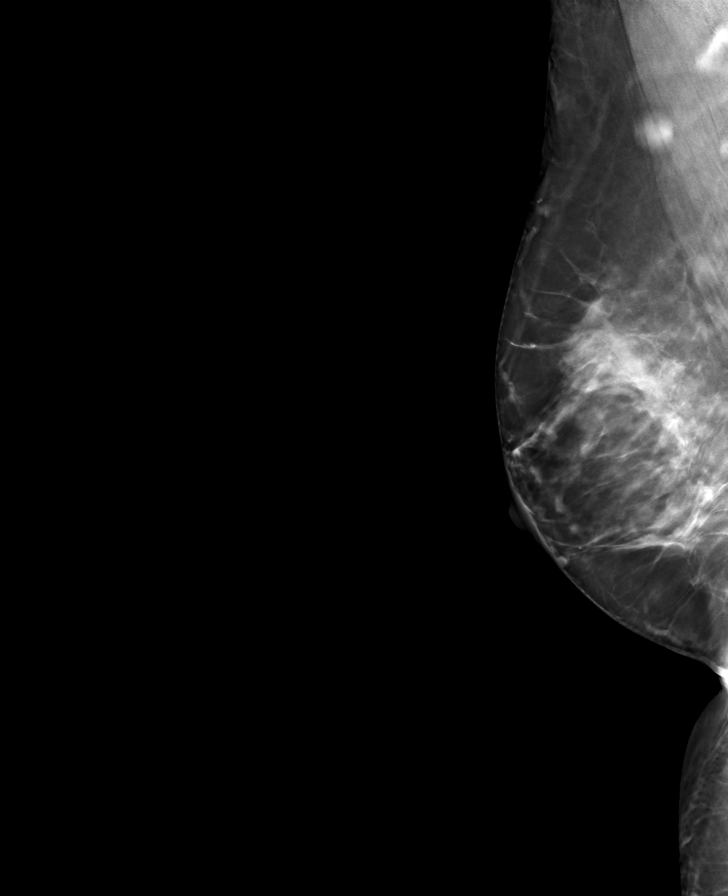

[8 of 24 positions shown; findings below may reference images not displayed]

ACR Breast Density Category c: The breast tissue is heterogeneously
dense, which may obscure small masses.
FINDINGS: There are no findings suspicious for malignancy.
IMPRESSION: No mammographic evidence of malignancy. A result letter of this
screening mammogram will be mailed directly to the patient.

RECOMMENDATION:
Screening mammogram in one year. (Code:Q3-[id number])

BI-RADS CATEGORY  1: Negative.

## 2023-01-20 ENCOUNTER — Other Ambulatory Visit: Payer: Self-pay | Admitting: Family Medicine

## 2023-01-20 DIAGNOSIS — Z0000 Encounter for general adult medical examination without abnormal findings: Secondary | ICD-10-CM

## 2023-02-09 ENCOUNTER — Other Ambulatory Visit: Payer: Self-pay | Admitting: Internal Medicine

## 2023-02-09 DIAGNOSIS — E890 Postprocedural hypothyroidism: Secondary | ICD-10-CM

## 2023-03-06 ENCOUNTER — Ambulatory Visit: Payer: Medicare Other | Admitting: Internal Medicine

## 2023-03-31 ENCOUNTER — Ambulatory Visit: Payer: Medicare Other

## 2023-04-06 ENCOUNTER — Ambulatory Visit: Payer: Medicare Other | Admitting: Internal Medicine

## 2023-04-06 ENCOUNTER — Other Ambulatory Visit: Payer: Self-pay | Admitting: Internal Medicine

## 2023-04-06 ENCOUNTER — Encounter: Payer: Self-pay | Admitting: Internal Medicine

## 2023-04-06 VITALS — BP 136/60 | HR 77 | Ht 60.0 in | Wt 141.4 lb

## 2023-04-06 DIAGNOSIS — Z23 Encounter for immunization: Secondary | ICD-10-CM

## 2023-04-06 DIAGNOSIS — R7303 Prediabetes: Secondary | ICD-10-CM | POA: Diagnosis not present

## 2023-04-06 DIAGNOSIS — E782 Mixed hyperlipidemia: Secondary | ICD-10-CM | POA: Diagnosis not present

## 2023-04-06 DIAGNOSIS — E890 Postprocedural hypothyroidism: Secondary | ICD-10-CM | POA: Diagnosis not present

## 2023-04-06 NOTE — Patient Instructions (Addendum)
Please continue Levothyroxine 75 mcg daily.  Take the thyroid hormone every day, with water, at least 30 minutes before breakfast, separated by at least 4 hours from: - acid reflux medications - calcium - iron - multivitamins  Please stop at the lab.  Check sugars 1x a day or every other day, rotating check times.  Please come back for a follow-up appointment in 1 year.

## 2023-04-06 NOTE — Progress Notes (Unsigned)
Patient ID: Vanessa Steele, female   DOB: 1951/04/02, 71 y.o.   MRN: [id number]   HPI  Vanessa Steele is a 72 y.o.-year-old female, presenting for follow-up for postablative hypothyroidism and prediabetes.  Last visit 1 year and 1 month ago. PCP at [facility].  Interim history: No increased urination, blurry vision, nausea, chest pain.  Hypothyroidism  -Developed in 06/2018 after RAI ablation for Graves' disease  She is on levothyroxine 75 mcg daily: - in am, around 6 am - fasting - stopped coffee + creamer - + calcium at least 4 hours after levothyroxine (lunchtime) - no iron - no multivitamins - no PPIs - not on Biotin On Turmeric.  Reviewed her TFTs:  Lab Results  Component Value Date   TSH 1.06 03/05/2022   TSH 3.42 03/05/2021   TSH 2.40 01/13/2020   TSH 3.26 04/12/2019   TSH 1.55 08/28/2017   TSH 1.26 11/24/2016   FREET4 1.25 03/05/2022   FREET4 0.93 03/05/2021   FREET4 1.2 01/13/2020   FREET4 1.06 04/12/2019   FREET4 0.99 08/28/2017   FREET4 0.82 11/24/2016  11/05/2015: TSH 2.010, free T4 1.27 07/10/2015: TSH 2.59 03/10/2015: TSH 0.109, free T4 1.78 09/30/2014: TSH 0.365, free T4 1.79  Pt denies: - feeling nodules in neck - hoarseness - dysphagia - choking  She has + FH of thyroid disorders in: 2 sisters: hyper and hypothyroidism.   No FH of thyroid cancer. No h/o radiation tx to head or neck except for RAI treatment in 2008.  Prediabetes:  She was on metformin 1000 mg twice a day, but stopped due to negative publicity.  We did not have to restart this due to good control.  She is not checking sugars, from before: - am: 140-150  - before lunch: 130-140  Reviewed HbA1c levels: Lab Results  Component Value Date   HGBA1C 6.0 (A) 03/05/2022   HGBA1C 5.9 (A) 03/05/2021   HGBA1C 6.2 (A) 08/30/2020   HGBA1C 6.0 (A) 01/13/2020   HGBA1C 6.1 04/12/2019   HGBA1C 6.0 08/28/2017   HGBA1C 6.1 11/24/2016   HGBA1C 5.8 04/25/2016  11/12/2015: HbA1c:  6.1%  No CKD.  Latest BUN/creatinine: Lab Results  Component Value Date   BUN 6 03/05/2022   BUN 7 03/05/2021   CREATININE 0.67 03/05/2022   CREATININE 0.69 03/05/2021   Lab Results  Component Value Date   MICRALBCREAT 0.7 03/05/2022   MICRALBCREAT 0.6 03/05/2021   MICRALBCREAT 0.6 11/24/2016  On Lisinopril 5 mg daily.  + HL; last Lipid panel: Lab Results  Component Value Date   CHOL 201 (H) 03/05/2022   HDL 51.90 03/05/2022   LDLCALC 131 (H) 03/05/2022   TRIG 92.0 03/05/2022   CHOLHDL 4 03/05/2022  07/10/2015: 221/87/65/139. She did not start Lipitor 20 as suggested, as she wanted to work on her diet.  At last visit, she was taking it inconsistently, approximately once a week and I recommended to take it daily. Still taking it inconsistently - ~every other day.  Last eye exam -10/2021: No DR reportedly.  No family history of type 2 diabetes. Mother and father with HTN.  ROS:  + see HPI  I reviewed pt's medications, allergies, PMH, social hx, family hx, and changes were documented in the history of present illness. Otherwise, unchanged from my initial visit note.  PMH: - Hypothyroidism, postablative - Prediabetes  No past surgical history.  Social History   Social History   Marital status: Legally Separated    Spouse name: N/A  Number of children: 0   Occupational History   retired   Social History Main Topics   Smoking status: Current Every Day Smoker: 1/2-1 PPD   Smokeless tobacco: No   Alcohol use No   Drug use: No   Meds: Current Outpatient Medications  Medication Sig Dispense Refill   atorvastatin (LIPITOR) 20 MG tablet Take 1 tablet (20 mg total) by mouth daily. 90 tablet 3   diclofenac Sodium (VOLTAREN) 1 % GEL Apply 1 application topically 2 (two) times daily as needed (pain).     levothyroxine (SYNTHROID) 75 MCG tablet TAKE 1 TABLET BY MOUTH ONCE DAILY BEFORE BREAKFAST 90 tablet 0   No current facility-administered medications for this  visit.  - vitamin D 1000 IU daily - K 550 mg daily - Mg 400 mg daily - Centrum MVI 1 a day  NKDA   FH: - see HPI+ HTN in M and F Heart ds in M Cancer in F  PE: There were no vitals taken for this visit. Wt Readings from Last 3 Encounters:  03/05/22 147 lb 9.6 oz (67 kg)  04/02/21 142 lb (64.4 kg)  03/05/21 143 lb 6.4 oz (65 kg)   Constitutional: normal weight, in NAD Eyes: EOMI, no exophthalmos ENT:  no thyromegaly, no cervical lymphadenopathy Cardiovascular: RRR, No MRG Respiratory: CTA B Musculoskeletal: no deformities Skin: no rashes Neurological: no tremor with outstretched hands  ASSESSMENT: 1. Hypothyroidism - postablative  2. Prediabetes  3. HL  PLAN:  1. Patient with hypothyroidism developed after RAI treatment for Graves' disease.  She is on levothyroxine treatment. - latest thyroid labs reviewed with pt. >> normal: Lab Results  Component Value Date   TSH 1.06 03/05/2022  - she continues on LT4 75 mcg daily - pt feels good on this dose. - we discussed about taking the thyroid hormone every day, with water, >30 minutes before breakfast, separated by >4 hours from acid reflux medications, calcium, iron, multivitamins. Pt. is taking it correctly. - will check thyroid tests today: TSH and fT4 - If labs are abnormal, she will need to return for repeat TFTs in 1.5 months  2. Prediabetes -She was previously on metformin, but then came off due to negative publicity - we did not restart it afterwards. -We discussed at last visit about checking blood sugars once a day, rotating check times.  She did not start checking blood sugars.  We discussed that if she is not checking, then I will need to bring her for visits every 3 to 4 months to check her HbA1c.  However, she does agree to start taking once a day or every other day. -At last visit, HbA1c was slightly higher, 6.0% -At today's visit, we will recheck her HbA1c -Recommended annual eye exams - she is due -  return to clinic in 1 year  3. HL -Reviewed latest lipid panel from 02/2022: LDL above target: Lab Results  Component Value Date   CHOL 201 (H) 03/05/2022   HDL 51.90 03/05/2022   LDLCALC 131 (H) 03/05/2022   TRIG 92.0 03/05/2022   CHOLHDL 4 03/05/2022  -Previously suggested Lipitor 20 mg daily but at last visit, she was taking it once a week >> advised her to move it daily -She for another lipid panel-will check today  + Flu shot today  Carlus Pavlov, MD PhD [facility]

## 2023-04-07 LAB — COMPREHENSIVE METABOLIC PANEL
ALT: 8 U/L (ref 0–35)
AST: 14 U/L (ref 0–37)
Albumin: 4.3 g/dL (ref 3.5–5.2)
Alkaline Phosphatase: 83 U/L (ref 39–117)
BUN: 5 mg/dL — ABNORMAL LOW (ref 6–23)
CO2: 32 meq/L (ref 19–32)
Calcium: 10 mg/dL (ref 8.4–10.5)
Chloride: 103 meq/L (ref 96–112)
Creatinine, Ser: 0.72 mg/dL (ref 0.40–1.20)
GFR: 83.49 mL/min (ref >60.00)
Glucose, Bld: 81 mg/dL (ref 70–99)
Potassium: 3.8 meq/L (ref 3.5–5.1)
Sodium: 142 meq/L (ref 135–145)
Total Bilirubin: 0.6 mg/dL (ref 0.2–1.2)
Total Protein: 7.8 g/dL (ref 6.0–8.3)

## 2023-04-07 LAB — MICROALBUMIN / CREATININE URINE RATIO
Creatinine,U: 51.2 mg/dL
Microalb Creat Ratio: 1.4 mg/g (ref 0.0–30.0)
Microalb, Ur: <0.7 mg/dL (ref 0.0–1.9)

## 2023-04-07 LAB — HEMOGLOBIN A1C: Hgb A1c MFr Bld: 6.3 % (ref 4.6–6.5)

## 2023-04-07 LAB — LIPID PANEL
Cholesterol: 187 mg/dL (ref 0–200)
HDL: 56.7 mg/dL (ref >39.00)
LDL Cholesterol: 108 mg/dL — ABNORMAL HIGH (ref 0–99)
NonHDL: 129.95
Total CHOL/HDL Ratio: 3
Triglycerides: 111 mg/dL (ref 0.0–149.0)
VLDL: 22.2 mg/dL (ref 0.0–40.0)

## 2023-04-07 LAB — T4, FREE: Free T4: 1.31 ng/dL (ref 0.60–1.60)

## 2023-04-07 LAB — TSH: TSH: 0.74 u[IU]/mL (ref 0.35–5.50)

## 2023-04-07 MED ORDER — LEVOTHYROXINE SODIUM 75 MCG PO TABS: 75.0000 ug | ORAL_TABLET | Freq: Every day | ORAL | 0 refills | Status: DC

## 2023-04-08 ENCOUNTER — Other Ambulatory Visit (HOSPITAL_COMMUNITY): Payer: Self-pay | Admitting: Family Medicine

## 2023-04-08 DIAGNOSIS — Z1231 Encounter for screening mammogram for malignant neoplasm of breast: Secondary | ICD-10-CM

## 2023-04-09 ENCOUNTER — Ambulatory Visit (HOSPITAL_COMMUNITY)
Admission: RE | Admit: 2023-04-09 | Discharge: 2023-04-09 | Disposition: A | Discharge status: Home / Self Care | Payer: Medicare Other | Source: Ambulatory Visit

## 2023-04-09 ENCOUNTER — Encounter (HOSPITAL_COMMUNITY): Payer: Self-pay

## 2023-04-09 DIAGNOSIS — Z1231 Encounter for screening mammogram for malignant neoplasm of breast: Secondary | ICD-10-CM | POA: Diagnosis present

## 2023-04-29 ENCOUNTER — Other Ambulatory Visit: Payer: Self-pay | Admitting: Internal Medicine

## 2023-04-29 DIAGNOSIS — E890 Postprocedural hypothyroidism: Secondary | ICD-10-CM

## 2023-04-29 MED ORDER — LEVOTHYROXINE SODIUM 75 MCG PO TABS: 75.0000 ug | ORAL_TABLET | Freq: Every day | ORAL | 2 refills | Status: DC

## 2024-01-12 ENCOUNTER — Ambulatory Visit (INDEPENDENT_AMBULATORY_CARE_PROVIDER_SITE_OTHER): Admitting: Orthopedic Surgery

## 2024-01-12 ENCOUNTER — Other Ambulatory Visit (INDEPENDENT_AMBULATORY_CARE_PROVIDER_SITE_OTHER): Payer: Self-pay

## 2024-01-12 ENCOUNTER — Encounter: Payer: Self-pay | Admitting: Orthopedic Surgery

## 2024-01-12 VITALS — BP 159/82 | HR 80 | Ht 60.0 in | Wt 141.0 lb

## 2024-01-12 DIAGNOSIS — M25562 Pain in left knee: Secondary | ICD-10-CM

## 2024-01-12 DIAGNOSIS — G8929 Other chronic pain: Secondary | ICD-10-CM

## 2024-01-12 NOTE — Progress Notes (Signed)
 New Patient Visit  Assessment: Vanessa Steele is a 73 y.o. female with the following: 1. Chronic pain of left knee  Plan: Vanessa Steele has pain in the left knee.  It has been ongoing for several months.  She is ambulating with a cane.  She has tried medicines.  She has tried topical treatments.  She has had an injection greater than 6 weeks ago, with only few days of relief.  She does have tenderness over the proximal tibia, with concerns for an insufficiency fracture.  As a result, I would like to obtain an MRI for further evaluation.  Once the MRI is completed, she will return to clinic to discuss the findings.  Follow-up: Return for After MRI.  Subjective:  Chief Complaint  Patient presents with   Knee Pain    Left     History of Present Illness: Vanessa Steele is a 73 y.o. female who presents for evaluation of left knee pain.  She reports that she has had progressively worsening pain in her left knee for several months.  No specific injury.  She has noted some swelling.  She has tenderness over the lateral and medial joint line.  She has tried medicines.  She is currently walking with a cane.  She has tried topical treatments.  She had an injection a little over a month ago, and provided some improvement in her symptoms for about 3 days.  Since then, the pain is severe.  It is interfering with her regular activities.  She has difficulty sleeping at night.   Review of Systems: No fevers or chills No numbness or tingling No chest pain No shortness of breath No bowel or bladder dysfunction No GI distress No headaches   Medical History:  Past Medical History:  Diagnosis Date   Thyroid  disease     Past Surgical History:  Procedure Laterality Date   COLONOSCOPY WITH PROPOFOL  N/A 04/02/2021   Procedure: COLONOSCOPY WITH PROPOFOL ;  Surgeon: Cindie Carlin POUR, DO;  Location: [facility];  Service: Endoscopy;  Laterality: N/A;  10:30 / ASA II    Family History   Problem Relation Age of Onset   Hypertension Mother    Heart failure Mother    Depression Father    Colon cancer Father    Hypertension Father    Hypothyroidism Sister    Hyperlipidemia Sister    Dementia Paternal Grandmother    Heart attack Paternal Grandfather    CAD Paternal Grandfather    Sickle cell trait Brother    Colon cancer Brother    Breast cancer Neg Hx    Social History   Tobacco Use   Smoking status: Every Day    Current packs/day: 0.50    Types: Cigarettes   Smokeless tobacco: Never  Vaping Use   Vaping status: Never Used  Substance Use Topics   Alcohol use: No   Drug use: No    No Known Allergies  Current Meds  Medication Sig   celecoxib (CELEBREX) 200 MG capsule Take 200 mg by mouth 2 (two) times daily as needed.    Objective: BP (!) 159/82   Pulse 80   Ht 5' (1.524 m)   Wt 141 lb (64 kg)   BMI 27.54 kg/m   Physical Exam:  General: Alert and oriented. and No acute distress. Gait: Ambulates with the assistance of a cane  Left knee with mild swelling.  Tenderness to palpation over the medial joint line.  Tenderness to palpation over  the medial tibial plateau.  Pain with flexion beyond 110 degrees.  She is able to achieve full extension.  No pain with patellar grind.  Knee is stable to varus and valgus stress.  Negative Lachman.  IMAGING: I personally ordered and reviewed the following images  X-rays of the left knee were obtained in clinic today.  No acute injuries are noted.  Mild loss of joint space within the medial compartment.  Little to no joint space narrowing in the lateral compartment.  Minimal osteophytes throughout the knee.  No bony lesions.  Impression: Left knee x-rays without acute injury, mild degenerative changes overall   New Medications:  No orders of the defined types were placed in this encounter.     Oneil DELENA Horde, MD  01/12/2024 1:38 PM

## 2024-01-18 ENCOUNTER — Ambulatory Visit (HOSPITAL_COMMUNITY)
Admission: RE | Admit: 2024-01-18 | Discharge: 2024-01-18 | Disposition: A | Discharge status: Home / Self Care | Source: Ambulatory Visit | Attending: Orthopedic Surgery | Admitting: Orthopedic Surgery

## 2024-01-18 DIAGNOSIS — G8929 Other chronic pain: Secondary | ICD-10-CM | POA: Diagnosis present

## 2024-01-18 DIAGNOSIS — M25562 Pain in left knee: Secondary | ICD-10-CM | POA: Diagnosis present

## 2024-02-03 ENCOUNTER — Ambulatory Visit: Admitting: Orthopedic Surgery

## 2024-02-03 ENCOUNTER — Encounter: Payer: Self-pay | Admitting: Orthopedic Surgery

## 2024-02-03 VITALS — BP 182/79 | HR 67

## 2024-02-03 DIAGNOSIS — M25562 Pain in left knee: Secondary | ICD-10-CM

## 2024-02-03 DIAGNOSIS — G8929 Other chronic pain: Secondary | ICD-10-CM

## 2024-02-03 NOTE — Patient Instructions (Signed)

## 2024-02-03 NOTE — Progress Notes (Signed)
 Return patient Visit  Assessment: Vanessa Steele is a 73 y.o. female with the following: 1. Chronic pain of left knee  Plan: Vanessa Steele continues to have pain in her left knee.  She has good days and bad days.  We reviewed the MRI findings in clinic today, which are negative for meniscus tear, bone bruising or ligament injuries.  No surgical intervention is warranted.  Mild to moderate degenerative changes within the knee.  This was discussed with the patient.  All questions have been answered.  She is interested in physical therapy.  Will place a referral for therapy.  In addition, I provided her with simple exercises to initiate at home.  Follow-up: Return if symptoms worsen or fail to improve.  Subjective:  Chief Complaint  Patient presents with   Results    MRI - Left knee    History of Present Illness: Vanessa Steele is a 73 y.o. female who returns for evaluation of left knee pain.  She continues to have pain in her left knee.  Pain is primarily medial.  She uses a cane to assist with ambulation.  No specific injury.  She has not worked with therapy.  She reports that she had an injection previously, and only improved her symptoms for about 3 days.  Review of Systems: No fevers or chills No numbness or tingling No chest pain No shortness of breath No bowel or bladder dysfunction No GI distress No headaches     Objective: BP (!) 182/79   Pulse 67   Physical Exam:  General: Alert and oriented. and No acute distress. Gait: Ambulates with the assistance of a cane  Left knee with mild swelling.  Tenderness to palpation over the medial joint line.  Tenderness to palpation over the medial tibial plateau.  Pain with flexion beyond 110 degrees.  She is able to achieve full extension.  No pain with patellar grind.  Knee is stable to varus and valgus stress.  Negative Lachman.  IMAGING: I personally ordered and reviewed the following images  Left knee  MRI  Impression:  Diminutive medial meniscus consistent with chronic degenerative injury and/or prior meniscectomy. No active tear.   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   Small joint effusion.   New Medications:  No orders of the defined types were placed in this encounter.     Vanessa Vanessa Horde, MD  02/03/2024 10:26 AM

## 2024-03-23 ENCOUNTER — Other Ambulatory Visit: Payer: Self-pay

## 2024-03-23 ENCOUNTER — Encounter (HOSPITAL_COMMUNITY): Payer: Self-pay

## 2024-03-23 ENCOUNTER — Ambulatory Visit (HOSPITAL_COMMUNITY): Attending: Orthopedic Surgery

## 2024-03-23 DIAGNOSIS — M25662 Stiffness of left knee, not elsewhere classified: Secondary | ICD-10-CM | POA: Diagnosis present

## 2024-03-23 DIAGNOSIS — G8929 Other chronic pain: Secondary | ICD-10-CM | POA: Diagnosis present

## 2024-03-23 DIAGNOSIS — M25562 Pain in left knee: Secondary | ICD-10-CM | POA: Insufficient documentation

## 2024-03-23 DIAGNOSIS — Z7409 Other reduced mobility: Secondary | ICD-10-CM | POA: Insufficient documentation
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
## 2024-03-29 ENCOUNTER — Ambulatory Visit (HOSPITAL_COMMUNITY)

## 2024-03-29 ENCOUNTER — Encounter (HOSPITAL_COMMUNITY): Payer: Self-pay

## 2024-03-29 DIAGNOSIS — G8929 Other chronic pain: Secondary | ICD-10-CM

## 2024-03-29 DIAGNOSIS — M25662 Stiffness of left knee, not elsewhere classified: Secondary | ICD-10-CM

## 2024-03-29 DIAGNOSIS — Z7409 Other reduced mobility: Secondary | ICD-10-CM

## 2024-03-29 DIAGNOSIS — M25562 Pain in left knee: Secondary | ICD-10-CM | POA: Diagnosis not present
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
## 2024-04-01 ENCOUNTER — Encounter (HOSPITAL_COMMUNITY): Payer: Self-pay

## 2024-04-01 ENCOUNTER — Ambulatory Visit (HOSPITAL_COMMUNITY)

## 2024-04-01 DIAGNOSIS — M25562 Pain in left knee: Secondary | ICD-10-CM | POA: Diagnosis not present

## 2024-04-01 DIAGNOSIS — Z7409 Other reduced mobility: Secondary | ICD-10-CM

## 2024-04-01 DIAGNOSIS — M25662 Stiffness of left knee, not elsewhere classified: Secondary | ICD-10-CM

## 2024-04-01 DIAGNOSIS — G8929 Other chronic pain: Secondary | ICD-10-CM
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
## 2024-04-05 ENCOUNTER — Ambulatory Visit: Payer: Medicare Other | Admitting: Internal Medicine

## 2024-04-05 ENCOUNTER — Encounter (HOSPITAL_COMMUNITY)

## 2024-04-07 ENCOUNTER — Ambulatory Visit: Payer: Medicare Other | Admitting: Internal Medicine

## 2024-04-07 ENCOUNTER — Encounter: Payer: Self-pay | Admitting: Internal Medicine

## 2024-04-07 VITALS — BP 130/70 | HR 73 | Ht 60.0 in | Wt 126.8 lb

## 2024-04-07 DIAGNOSIS — R7303 Prediabetes: Secondary | ICD-10-CM

## 2024-04-07 DIAGNOSIS — E782 Mixed hyperlipidemia: Secondary | ICD-10-CM | POA: Diagnosis not present

## 2024-04-07 DIAGNOSIS — E890 Postprocedural hypothyroidism: Secondary | ICD-10-CM

## 2024-04-07 NOTE — Patient Instructions (Addendum)
Please continue Levothyroxine 75 mcg daily.  Take the thyroid hormone every day, with water, at least 30 minutes before breakfast, separated by at least 4 hours from: - acid reflux medications - calcium - iron - multivitamins  Please stop at the lab.  Please come back for a follow-up appointment in 1 year. 

## 2024-04-07 NOTE — Progress Notes (Signed)
 Patient ID: Vanessa Steele, female   DOB: 1950-11-21, 73 y.o.   MRN: [id number]   HPI  Vanessa Steele is a 73 y.o. very pleasant female, presenting for follow-up for postablative hypothyroidism and prediabetes.  Last visit 1 year ago. PCP at [facility].  Interim history: No increased urination, blurry vision, nausea, chest pain. She has left knee pain and is in physical therapy. She lost 15 pounds since last visit, she is not sure how mentions that she is not usually hungry during the Summer.  Hypothyroidism  -Developed in 06/2018 after RAI ablation for Graves' disease  She is on levothyroxine  75 mcg daily: - in am - fasting - restarted coffee + creamer 1h later, but not drinking it daily - + calcium  at least 4 hours after levothyroxine  (lunchtime) - no iron - no multivitamins - no PPIs - not on Biotin - off and on Turmeric.  Reviewed her TFTs:  Lab Results  Component Value Date   TSH 0.74 04/06/2023   TSH 1.06 03/05/2022   TSH 3.42 03/05/2021   TSH 2.40 01/13/2020   TSH 3.26 04/12/2019   TSH 1.55 08/28/2017   FREET4 1.31 04/06/2023   FREET4 1.25 03/05/2022   FREET4 0.93 03/05/2021   FREET4 1.2 01/13/2020   FREET4 1.06 04/12/2019   FREET4 0.99 08/28/2017  11/05/2015: TSH 2.010, free T4 1.27 07/10/2015: TSH 2.59 03/10/2015: TSH 0.109, free T4 1.78 09/30/2014: TSH 0.365, free T4 1.79  Pt denies: - feeling nodules in neck - hoarseness - dysphagia - choking  She has + FH of thyroid  disorders in: 2 sisters: hyper and hypothyroidism.  No FH of thyroid  cancer. No h/o radiation tx to head or neck except for RAI treatment in 2008.  Prediabetes:  She was on metformin 1000 mg twice a day, but stopped due to negative publicity.  We did not have to restart this due to good control.  She is still not checking blood sugars.  Previously: - am: 140-150  - before lunch: 130-140  Reviewed HbA1c levels: 03/24/2024: HbA1c 5.8% Lab Results  Component Value Date    HGBA1C 6.3 04/06/2023   HGBA1C 6.0 (A) 03/05/2022   HGBA1C 5.9 (A) 03/05/2021   HGBA1C 6.2 (A) 08/30/2020   HGBA1C 6.0 (A) 01/13/2020   HGBA1C 6.1 04/12/2019   HGBA1C 6.0 08/28/2017   HGBA1C 6.1 11/24/2016   HGBA1C 5.8 04/25/2016  11/12/2015: HbA1c: 6.1%  No CKD.  Latest BUN/creatinine: Lab Results  Component Value Date   BUN 5 (L) 04/06/2023   BUN 6 03/05/2022   CREATININE 0.72 04/06/2023   CREATININE 0.67 03/05/2022   No results found for: MICRALBCREAT On Lisinopril 5 mg daily.  + HL; last Lipid panel: Lab Results  Component Value Date   CHOL 187 04/06/2023   HDL 56.70 04/06/2023   LDLCALC 108 (H) 04/06/2023   TRIG 111.0 04/06/2023   CHOLHDL 3 04/06/2023  07/10/2015: 221/87/65/139. On Lipitor 20 mg - she was taking it inconsistently, approximately once a week and I recommended to take it daily.   Last eye exam -10/2021: No DR reportedly.  No family history of type 2 diabetes. Mother and father with HTN.  ROS:  + see HPI  I reviewed pt's medications, allergies, PMH, social hx, family hx, and changes were documented in the history of present illness. Otherwise, unchanged from my initial visit note.  PMH: - Hypothyroidism, postablative - Prediabetes  No past surgical history.  Social History   Social History   Marital status: Legally Separated  Spouse name: N/A   Number of children: 0   Occupational History   retired   Social History Main Topics   Smoking status: Current Every Day Smoker: 1/2-1 PPD   Smokeless tobacco: No   Alcohol use No   Drug use: No   Meds: Current Outpatient Medications  Medication Sig Dispense Refill   atorvastatin  (LIPITOR) 20 MG tablet Take 1 tablet (20 mg total) by mouth daily. 90 tablet 3   celecoxib (CELEBREX) 200 MG capsule Take 200 mg by mouth 2 (two) times daily as needed.     diclofenac Sodium (VOLTAREN) 1 % GEL Apply 1 application topically 2 (two) times daily as needed (pain).     levothyroxine  (SYNTHROID ) 75  MCG tablet Take 1 tablet (75 mcg total) by mouth daily before breakfast. 90 tablet 2   lisinopril (ZESTRIL) 5 MG tablet Take 5 mg by mouth daily.     No current facility-administered medications for this visit.  - vitamin D 1000 IU daily - K 550 mg daily - Mg 400 mg daily - Centrum MVI 1 a day  NKDA   FH: - see HPI+ HTN in M and F Heart ds in M Cancer in F  PE: BP 130/70   Pulse 73   Ht 5' (1.524 m)   Wt 126 lb 12.8 oz (57.5 kg)   SpO2 98%   BMI 24.76 kg/m  Wt Readings from Last 3 Encounters:  04/07/24 126 lb 12.8 oz (57.5 kg)  01/12/24 141 lb (64 kg)  04/06/23 141 lb 6.4 oz (64.1 kg)   Constitutional: normal weight, in NAD Eyes: EOMI, no exophthalmos ENT:  no thyromegaly, no cervical lymphadenopathy Cardiovascular: RRR, No MRG Respiratory: CTA B Musculoskeletal: no deformities Skin: no rashes Neurological: no tremor with outstretched hands  ASSESSMENT: 1. Hypothyroidism - postablative  2. Prediabetes  3. HL  PLAN:  1. Patient with hypothyroidism developed after RAI treatment for Graves' disease.  She is on levothyroxine  treatment. - latest thyroid  labs reviewed with pt. >> normal: Lab Results  Component Value Date   TSH 0.74 04/06/2023  - she continues on LT4 75 mcg daily - pt feels good on this dose. - we discussed about taking the thyroid  hormone every day, with water, >30 minutes before breakfast, separated by >4 hours from acid reflux medications, calcium , iron, multivitamins. Pt. is taking it correctly. - will check thyroid  tests today: TSH and fT4 - If labs are abnormal, she will need to return for repeat TFTs in 1.5 months  2. Prediabetes - She was previously on metformin but then came off due to negative publicity and we did not restart it afterwards - I recommended to check sugars once a day or at least every other 2-3 days.  We discussed that if she is not checking, then I will need to bring her for visits every 3 to 4 months to check her HbA1c.   At last visit she agreed to check more frequently. - Latest HbA1c was higher, at 6.3% 1 year ago - she had another HbA1c which was lower, at 5.8% on 03/24/2024 (see scanned records from [facility]) - Recommended annual eye exams - return to clinic in 1 year  3. HL - Latest lipid panel was reviewed from a year ago, LDL was above target while the rest of the fractions were at goal: Lab Results  Component Value Date   CHOL 187 04/06/2023   HDL 56.70 04/06/2023   LDLCALC 108 (H) 04/06/2023   TRIG  111.0 04/06/2023   CHOLHDL 3 04/06/2023  - She was previously on Lipitor 20 mg once a week but I did advise her to try to take it daily - she has an appt coming up with her new PCP 11/14/22025  Needs refills.  Orders Placed This Encounter  Procedures   TSH   T4, free    Lela Fendt, MD PhD [facility] Endocrinology

## 2024-04-08 ENCOUNTER — Ambulatory Visit: Payer: Self-pay | Admitting: Internal Medicine

## 2024-04-08 ENCOUNTER — Ambulatory Visit (HOSPITAL_COMMUNITY)

## 2024-04-08 DIAGNOSIS — M25662 Stiffness of left knee, not elsewhere classified: Secondary | ICD-10-CM

## 2024-04-08 DIAGNOSIS — M25562 Pain in left knee: Secondary | ICD-10-CM | POA: Diagnosis not present

## 2024-04-08 DIAGNOSIS — Z7409 Other reduced mobility: Secondary | ICD-10-CM

## 2024-04-08 DIAGNOSIS — G8929 Other chronic pain: Secondary | ICD-10-CM

## 2024-04-08 LAB — TSH: TSH: 3.46 m[IU]/L (ref 0.40–4.50)

## 2024-04-08 LAB — T4, FREE: Free T4: 1.3 ng/dL (ref 0.8–1.8)

## 2024-04-08 MED ORDER — LEVOTHYROXINE SODIUM 75 MCG PO TABS: 75.0000 ug | ORAL_TABLET | Freq: Every day | ORAL | 3 refills | Status: AC

## 2024-04-08 NOTE — Addendum Note (Signed)
 Addended by: TRIXIE FILE on: 04/08/2024 10:48 AM   Modules accepted: Orders
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
## 2024-04-12 ENCOUNTER — Encounter (HOSPITAL_COMMUNITY): Payer: Self-pay

## 2024-04-12 ENCOUNTER — Ambulatory Visit (HOSPITAL_COMMUNITY)

## 2024-04-12 DIAGNOSIS — G8929 Other chronic pain: Secondary | ICD-10-CM

## 2024-04-12 DIAGNOSIS — Z7409 Other reduced mobility: Secondary | ICD-10-CM

## 2024-04-12 DIAGNOSIS — M25662 Stiffness of left knee, not elsewhere classified: Secondary | ICD-10-CM

## 2024-04-12 DIAGNOSIS — M25562 Pain in left knee: Secondary | ICD-10-CM | POA: Diagnosis not present
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
## 2024-04-14 ENCOUNTER — Encounter (HOSPITAL_COMMUNITY)

## 2024-04-19 ENCOUNTER — Ambulatory Visit (HOSPITAL_COMMUNITY)

## 2024-04-19 ENCOUNTER — Encounter (HOSPITAL_COMMUNITY): Payer: Self-pay

## 2024-04-19 DIAGNOSIS — G8929 Other chronic pain: Secondary | ICD-10-CM

## 2024-04-19 DIAGNOSIS — M25562 Pain in left knee: Secondary | ICD-10-CM | POA: Diagnosis not present

## 2024-04-19 DIAGNOSIS — M25662 Stiffness of left knee, not elsewhere classified: Secondary | ICD-10-CM

## 2024-04-19 DIAGNOSIS — Z7409 Other reduced mobility: Secondary | ICD-10-CM
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
## 2024-04-21 ENCOUNTER — Encounter (HOSPITAL_COMMUNITY)

## 2024-04-26 ENCOUNTER — Encounter (HOSPITAL_COMMUNITY)

## 2024-04-28 ENCOUNTER — Ambulatory Visit (HOSPITAL_COMMUNITY): Attending: Orthopedic Surgery

## 2024-04-28 ENCOUNTER — Encounter (HOSPITAL_COMMUNITY): Payer: Self-pay

## 2024-04-28 DIAGNOSIS — M25562 Pain in left knee: Secondary | ICD-10-CM | POA: Insufficient documentation

## 2024-04-28 DIAGNOSIS — G8929 Other chronic pain: Secondary | ICD-10-CM | POA: Diagnosis present

## 2024-04-28 DIAGNOSIS — Z7409 Other reduced mobility: Secondary | ICD-10-CM | POA: Diagnosis present

## 2024-04-28 DIAGNOSIS — M25662 Stiffness of left knee, not elsewhere classified: Secondary | ICD-10-CM | POA: Diagnosis present
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
## 2024-05-03 ENCOUNTER — Ambulatory Visit (HOSPITAL_COMMUNITY): Admitting: Physical Therapy

## 2024-05-03 DIAGNOSIS — G8929 Other chronic pain: Secondary | ICD-10-CM

## 2024-05-03 DIAGNOSIS — M25562 Pain in left knee: Secondary | ICD-10-CM | POA: Diagnosis not present

## 2024-05-03 DIAGNOSIS — M25662 Stiffness of left knee, not elsewhere classified: Secondary | ICD-10-CM

## 2024-05-03 DIAGNOSIS — Z7409 Other reduced mobility: Secondary | ICD-10-CM
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
## 2024-05-05 ENCOUNTER — Encounter (HOSPITAL_COMMUNITY)

## 2024-06-03 ENCOUNTER — Ambulatory Visit: Payer: Self-pay

## 2024-09-19 ENCOUNTER — Ambulatory Visit: Payer: Self-pay

## 2025-04-11 ENCOUNTER — Ambulatory Visit: Admitting: Internal Medicine
